# Patient Record
Sex: Female | Born: 1995 | Race: Black or African American | Hispanic: No | Marital: Single | Smoking: Never smoker
Health system: Southern US, Community
[De-identification: ages and names within clinical notes are randomized; demographics above are authoritative.]

## PROBLEM LIST (undated history)

## (undated) DIAGNOSIS — L309 Dermatitis, unspecified: Secondary | ICD-10-CM

## (undated) DIAGNOSIS — E0590 Thyrotoxicosis, unspecified without thyrotoxic crisis or storm: Secondary | ICD-10-CM

## (undated) DIAGNOSIS — D649 Anemia, unspecified: Secondary | ICD-10-CM

## (undated) HISTORY — DX: Anemia, unspecified: D64.9

## (undated) HISTORY — DX: Dermatitis, unspecified: L30.9

## (undated) HISTORY — PX: NO PAST SURGERIES: SHX2092

---
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
## 2016-12-03 ENCOUNTER — Encounter: Payer: Self-pay | Admitting: Family Medicine

## 2016-12-03 ENCOUNTER — Ambulatory Visit (INDEPENDENT_AMBULATORY_CARE_PROVIDER_SITE_OTHER): Payer: BLUE CROSS/BLUE SHIELD | Admitting: Family Medicine

## 2016-12-03 ENCOUNTER — Other Ambulatory Visit (HOSPITAL_COMMUNITY)
Admission: RE | Admit: 2016-12-03 | Discharge: 2016-12-03 | Disposition: A | Discharge status: Home / Self Care | Payer: BLUE CROSS/BLUE SHIELD | Source: Ambulatory Visit | Attending: Family Medicine | Admitting: Family Medicine

## 2016-12-03 VITALS — BP 120/80 | HR 64 | Ht 59.5 in | Wt 102.6 lb

## 2016-12-03 DIAGNOSIS — Z111 Encounter for screening for respiratory tuberculosis: Secondary | ICD-10-CM

## 2016-12-03 DIAGNOSIS — Z0000 Encounter for general adult medical examination without abnormal findings: Secondary | ICD-10-CM | POA: Diagnosis not present

## 2016-12-03 DIAGNOSIS — Z124 Encounter for screening for malignant neoplasm of cervix: Secondary | ICD-10-CM | POA: Diagnosis not present

## 2016-12-03 DIAGNOSIS — Z113 Encounter for screening for infections with a predominantly sexual mode of transmission: Secondary | ICD-10-CM

## 2016-12-03 DIAGNOSIS — Z7689 Persons encountering health services in other specified circumstances: Secondary | ICD-10-CM | POA: Insufficient documentation

## 2016-12-03 LAB — CBC WITH DIFFERENTIAL/PLATELET
BASOS ABS: 35 {cells}/uL (ref 0–200)
Basophils Relative: 1 %
EOS ABS: 35 {cells}/uL (ref 15–500)
Eosinophils Relative: 1 %
HEMATOCRIT: 41.2 % (ref 35.0–45.0)
Hemoglobin: 13.3 g/dL (ref 11.7–15.5)
LYMPHS PCT: 58 %
Lymphs Abs: 2030 cells/uL (ref 850–3900)
MCH: 29.6 pg (ref 27.0–33.0)
MCHC: 32.3 g/dL (ref 32.0–36.0)
MCV: 91.6 fL (ref 80.0–100.0)
MONOS PCT: 6 %
MPV: 10.1 fL (ref 7.5–12.5)
Monocytes Absolute: 210 cells/uL (ref 200–950)
NEUTROS PCT: 34 %
Neutro Abs: 1190 cells/uL — ABNORMAL LOW (ref 1500–7800)
PLATELETS: 254 10*3/uL (ref 140–400)
RBC: 4.5 MIL/uL (ref 3.80–5.10)
RDW: 13.5 % (ref 11.0–15.0)
WBC: 3.5 10*3/uL — AB (ref 4.0–10.5)

## 2016-12-03 LAB — COMPREHENSIVE METABOLIC PANEL
ALT: 12 U/L (ref 6–29)
AST: 20 U/L (ref 10–30)
Albumin: 4.2 g/dL (ref 3.6–5.1)
Alkaline Phosphatase: 61 U/L (ref 33–115)
BUN: 8 mg/dL (ref 7–25)
CALCIUM: 9 mg/dL (ref 8.6–10.2)
CHLORIDE: 106 mmol/L (ref 98–110)
CO2: 25 mmol/L (ref 20–31)
Creat: 0.6 mg/dL (ref 0.50–1.10)
Glucose, Bld: 91 mg/dL (ref 65–99)
POTASSIUM: 4.1 mmol/L (ref 3.5–5.3)
Sodium: 141 mmol/L (ref 135–146)
Total Bilirubin: 0.3 mg/dL (ref 0.2–1.2)
Total Protein: 7.4 g/dL (ref 6.1–8.1)

## 2016-12-03 LAB — POCT URINALYSIS DIPSTICK
BILIRUBIN UA: NEGATIVE
Glucose, UA: NEGATIVE
KETONES UA: NEGATIVE
LEUKOCYTES UA: NEGATIVE
Nitrite, UA: NEGATIVE
PH UA: 6
PROTEIN UA: NEGATIVE
SPEC GRAV UA: 1.03
Urobilinogen, UA: NEGATIVE — AB

## 2016-12-03 NOTE — Patient Instructions (Addendum)
You can call and schedule your Dentist appointment at any of the following offices:   [facility] Address: [address]  [place], [place] [address] Phone #: [phone]  [facility] Address: [address] [place], [place] [address] Phone # [phone]  J. Hazeline JunkerSelig Cooper, DDS [facility]  Address: [address]                                                                 [place], [place] [address] Phone #: [phone]   For your eye exam: you can try [facility] [phone]  I recommend you get your third HPV vaccine. Call and schedule this whenever you are ready.   Start getting at least 150 minutes of physical activity per week.    Preventative Care for Adults - Female      MAINTAIN REGULAR HEALTH EXAMS:  A routine yearly physical is a good way to check in with your primary care provider about your health and preventive screening. It is also an opportunity to share updates about your health and any concerns you have, and receive a thorough all-over exam.   Most health insurance companies pay for at least some preventative services.  Check with your health plan for specific coverages.  WHAT PREVENTATIVE SERVICES DO WOMEN NEED?  Adult women should have their weight and blood pressure checked regularly.   Women age 21 and older should have their cholesterol levels checked regularly.  Women should be screened for cervical cancer with a Pap smear and pelvic exam beginning at either age 21, or 3 years after they become sexually activity.    Breast cancer screening generally begins at age 21 with a mammogram and breast exam by your primary care provider.    Beginning at age 21 and continuing to age 21, women should be screened for colorectal cancer.  Certain people may need continued testing until age 21.  Updating vaccinations is part of preventative care.  Vaccinations help protect against diseases such as the  flu.  Osteoporosis is a disease in which the bones lose minerals and strength as we age. Women ages 8165 and over should discuss this with their caregivers, as should women after menopause who have other risk factors.  Lab tests are generally done as part of preventative care to screen for anemia and blood disorders, to screen for problems with the kidneys and liver, to screen for bladder problems, to check blood sugar, and to check your cholesterol level.  Preventative services generally include counseling about diet, exercise, avoiding tobacco, drugs, excessive alcohol consumption, and sexually transmitted infections.    GENERAL RECOMMENDATIONS FOR GOOD HEALTH:  Healthy diet:  Eat a variety of foods, including fruit, vegetables, animal or vegetable protein, such as meat, fish, chicken, and eggs, or beans, lentils, tofu, and grains, such as rice.  Drink plenty of water daily.  Decrease saturated fat in the diet, avoid lots of red meat, processed foods, sweets, fast foods, and fried foods.  Exercise:  Aerobic exercise helps maintain good heart health. At least 30-40 minutes of moderate-intensity exercise is recommended. For example, a brisk walk that increases your heart rate and breathing. This should be done on most days of the week.   Find a type  of exercise or a variety of exercises that you enjoy so that it becomes a part of your daily life.  Examples are running, walking, swimming, water aerobics, and biking.  For motivation and support, explore group exercise such as aerobic class, spin class, Zumba, Yoga,or  martial arts, etc.    Set exercise goals for yourself, such as a certain weight goal, walk or run in a race such as a 5k walk/run.  Speak to your primary care provider about exercise goals.  Disease prevention:  If you smoke or chew tobacco, find out from your caregiver how to quit. It can literally save your life, no matter how long you have been a tobacco user. If you do not  use tobacco, never begin.   Maintain a healthy diet and normal weight. Increased weight leads to problems with blood pressure and diabetes.   The Body Mass Index or BMI is a way of measuring how much of your body is fat. Having a BMI above 27 increases the risk of heart disease, diabetes, hypertension, stroke and other problems related to obesity. Your caregiver can help determine your BMI and based on it develop an exercise and dietary program to help you achieve or maintain this important measurement at a healthful level.  High blood pressure causes heart and blood vessel problems.  Persistent high blood pressure should be treated with medicine if weight loss and exercise do not work.   Fat and cholesterol leaves deposits in your arteries that can block them. This causes heart disease and vessel disease elsewhere in your body.  If your cholesterol is found to be high, or if you have heart disease or certain other medical conditions, then you may need to have your cholesterol monitored frequently and be treated with medication.   Ask if you should have a cardiac stress test if your history suggests this. A stress test is a test done on a treadmill that looks for heart disease. This test can find disease prior to there being a problem.  Menopause can be associated with physical symptoms and risks. Hormone replacement therapy is available to decrease these. You should talk to your caregiver about whether starting or continuing to take hormones is right for you.   Osteoporosis is a disease in which the bones lose minerals and strength as we age. This can result in serious bone fractures. Risk of osteoporosis can be identified using a bone density scan. Women ages 40 and over should discuss this with their caregivers, as should women after menopause who have other risk factors. Ask your caregiver whether you should be taking a calcium supplement and Vitamin D, to reduce the rate of osteoporosis.   Avoid  drinking alcohol in excess (more than two drinks per day).  Avoid use of street drugs. Do not share needles with anyone. Ask for professional help if you need assistance or instructions on stopping the use of alcohol, cigarettes, and/or drugs.  Brush your teeth twice a day with fluoride toothpaste, and floss once a day. Good oral hygiene prevents tooth decay and gum disease. The problems can be painful, unattractive, and can cause other health problems. Visit your dentist for a routine oral and dental check up and preventive care every 6-12 months.   Look at your skin regularly.  Use a mirror to look at your back. Notify your caregivers of changes in moles, especially if there are changes in shapes, colors, a size larger than a pencil eraser, an irregular border, or development  of new moles.  Safety:  Use seatbelts 100% of the time, whether driving or as a passenger.  Use safety devices such as hearing protection if you work in environments with loud noise or significant background noise.  Use safety glasses when doing any work that could send debris in to the eyes.  Use a helmet if you ride a bike or motorcycle.  Use appropriate safety gear for contact sports.  Talk to your caregiver about gun safety.  Use sunscreen with a SPF (or skin protection factor) of 15 or greater.  Lighter skinned people are at a greater risk of skin cancer. Don't forget to also wear sunglasses in order to protect your eyes from too much damaging sunlight. Damaging sunlight can accelerate cataract formation.   Practice safe sex. Use condoms. Condoms are used for birth control and to help reduce the spread of sexually transmitted infections (or STIs).  Some of the STIs are gonorrhea (the clap), chlamydia, syphilis, trichomonas, herpes, HPV (human papilloma virus) and HIV (human immunodeficiency virus) which causes AIDS. The herpes, HIV and HPV are viral illnesses that have no cure. These can result in disability, cancer and  death.   Keep carbon monoxide and smoke detectors in your home functioning at all times. Change the batteries every 6 months or use a model that plugs into the wall.   Vaccinations:  Stay up to date with your tetanus shots and other required immunizations. You should have a booster for tetanus every 10 years. Be sure to get your flu shot every year, since 5%-20% of the [place] population comes down with the flu. The flu vaccine changes each year, so being vaccinated once is not enough. Get your shot in the fall, before the flu season peaks.   Other vaccines to consider:  Human Papilloma Virus or HPV causes cancer of the cervix, and other infections that can be transmitted from person to person. There is a vaccine for HPV, and females should get immunized between the ages of 61 and 107. It requires a series of 3 shots.   Pneumococcal vaccine to protect against certain types of pneumonia.  This is normally recommended for adults age 15 or older.  However, adults younger than 21 years old with certain underlying conditions such as diabetes, heart or lung disease should also receive the vaccine.  Shingles vaccine to protect against Varicella Zoster if you are older than age 89, or younger than 21 years old with certain underlying illness.  Hepatitis A vaccine to protect against a form of infection of the liver by a virus acquired from food.  Hepatitis B vaccine to protect against a form of infection of the liver by a virus acquired from blood or body fluids, particularly if you work in health care.  If you plan to travel internationally, check with your local health department for specific vaccination recommendations.  Cancer Screening:  Breast cancer screening is essential to preventive care for women. All women age 30 and older should perform a breast self-exam every month. At age 15 and older, women should have their caregiver complete a breast exam each year. Women at ages 65 and older should have  a mammogram (x-ray film) of the breasts. Your caregiver can discuss how often you need mammograms.    Cervical cancer screening includes taking a Pap smear (sample of cells examined under a microscope) from the cervix (end of the uterus). It also includes testing for HPV (Human Papilloma Virus, which can cause cervical cancer). Screening  and a pelvic exam should begin at age 54, or 3 years after a woman becomes sexually active. Screening should occur every year, with a Pap smear but no HPV testing, up to age 76. After age 58, you should have a Pap smear every 3 years with HPV testing, if no HPV was found previously.   Most routine colon cancer screening begins at the age of 84. On a yearly basis, doctors may provide special easy to use take-home tests to check for hidden blood in the stool. Sigmoidoscopy or colonoscopy can detect the earliest forms of colon cancer and is life saving. These tests use a small camera at the end of a tube to directly examine the colon. Speak to your caregiver about this at age 81, when routine screening begins (and is repeated every 5 years unless early forms of pre-cancerous polyps or small growths are found).

## 2016-12-03 NOTE — Progress Notes (Signed)
Subjective:    Patient ID: Katie Pratt, female    DOB: 1996-06-14, 21 y.o.   MRN: [id number]  HPI Chief Complaint  Patient presents with  . new pt    new pt, cpe and tb skin with pap. will be getting eyes checked soon   She is new to the practice and here for a complete physical exam. She has not concerns or complaints today.   Previous medical care: [facility].  Last CPE: last year  Other providers: none  Social history: Lives with mother, works at [employer], Holiday representativejunior at [employer].  Denies smoking, drinking alcohol, drug use  Diet: unhealthy. Snacks during the day.  Excerise: hardly ever.   Immunizations: needs TB test. HPV- she only got 2 of these.   Health maintenance:  Mammogram: N/A Colonoscopy: N/A Last Pap Smear: never Last Menstrual cycle: 11/27/16 She is sexually active with one female partner.  Denies history of STIs.  Using condoms for contraception. Has tried patches and depo injections in the past.  Pregnancies: 0 Last Dental Exam: twice annually  Last Eye Exam: last year  Wears seatbelt always, uses sunscreen, smoke detectors in home and functioning, does not text while driving and feels safe in home environment.   Reviewed allergies, medications, past medical, surgical, family, and social history.   Review of Systems Review of Systems Constitutional: -fever, -chills, -sweats, -unexpected weight change,-fatigue ENT: -runny nose, -ear pain, -sore throat Cardiology:  -chest pain, -palpitations, -edema Respiratory: -cough, -shortness of breath, -wheezing Gastroenterology: -abdominal pain, -nausea, -vomiting, -diarrhea, -constipation  Hematology: -bleeding or bruising problems Musculoskeletal: -arthralgias, -myalgias, -joint swelling, -back pain Ophthalmology: -vision changes Urology: -dysuria, -difficulty urinating, -hematuria, -urinary frequency, -urgency Neurology: -headache, -weakness, -tingling, -numbness       Objective:   Physical Exam BP  120/80   Pulse 64   Ht 4' 11.5" (1.511 m)   Wt 102 lb 9.6 oz (46.5 kg)   LMP 11/27/2016   BMI 20.38 kg/m   General Appearance:    Alert, cooperative, no distress, appears stated age  Head:    Normocephalic, without obvious abnormality, atraumatic  Eyes:    PERRL, conjunctiva/corneas clear, EOM's intact, fundi    benign  Ears:    Normal TM's and external ear canals  Nose:   Nares normal, mucosa normal, no drainage or sinus   tenderness  Throat:   Lips, mucosa, and tongue normal; teeth and gums normal  Neck:   Supple, no lymphadenopathy;  thyroid:  no   enlargement/tenderness/nodules; no carotid   bruit or JVD  Back:    Spine nontender, no curvature, ROM normal, no CVA     tenderness  Lungs:     Clear to auscultation bilaterally without wheezes, rales or     ronchi; respirations unlabored  Chest Wall:    No tenderness or deformity   Heart:    Regular rate and rhythm, S1 and S2 normal, no murmur, rub   or gallop  Breast Exam:    Declined.    No axillary lymphadenopathy  Abdomen:     Soft, non-tender, nondistended, normoactive bowel sounds,    no masses, no hepatosplenomegaly  Genitalia:    Normal external genitalia without lesions.  BUS and vagina normal; cervix without lesions, or cervical motion tenderness. No abnormal vaginal discharge.  Uterus and adnexa not enlarged, nontender, no masses.  Pap performed. Chaperone present.   Rectal:    Not performed due to age<40 and no related complaints  Extremities:   No clubbing, cyanosis or  edema  Pulses:   2+ and symmetric all extremities  Skin:   Skin color, texture, turgor normal, no rashes or lesions  Lymph nodes:   Cervical, supraclavicular, and axillary nodes normal  Neurologic:   CNII-XII intact, normal strength, sensation and gait; reflexes 2+ and symmetric throughout          Psych:   Normal mood, affect, hygiene and grooming.    Urinalysis dipstick: blood 1+ (menses) otherwise neg      Assessment & Plan:  Routine general  medical examination at a health care facility - Plan: Urinalysis Dipstick, CBC with Differential/Platelet, Comprehensive metabolic panel  Encounter to establish care  Visit for TB skin test - Plan: TB Skin Test  Screening for STD (sexually transmitted disease) - Plan: RPR, HIV antibody  Screening for cervical cancer - Plan: Cytology - PAP  Discussed that she appears to be healthy. Recommend eating a healthy well balanced diet and starting to exercise at least 150 minutes per week.  She would like a TB skin test, needs this for college.  Her first pap smear was performed today, she tolerated this well. Chaperone present.  She is safe in her home and relationships.  STD testing performed.  Reports having 2 of the 3 shot series of HPV injections and refuses the 3rd one today. Will return if she decides to get this. Counseled her on the reason this is recommended.  Will follow up pending labs. She is not fasting, will not check lipids.

## 2016-12-04 LAB — RPR

## 2016-12-04 LAB — HIV ANTIBODY (ROUTINE TESTING W REFLEX): HIV 1&2 Ab, 4th Generation: NONREACTIVE

## 2016-12-05 LAB — CYTOLOGY - PAP
CHLAMYDIA, DNA PROBE: NEGATIVE
Diagnosis: NEGATIVE
NEISSERIA GONORRHEA: NEGATIVE

## 2016-12-06 ENCOUNTER — Other Ambulatory Visit: Payer: Self-pay | Admitting: Family Medicine

## 2016-12-06 DIAGNOSIS — D72819 Decreased white blood cell count, unspecified: Secondary | ICD-10-CM

## 2016-12-06 LAB — TB SKIN TEST: TB SKIN TEST: NEGATIVE

## 2017-03-13 ENCOUNTER — Encounter: Payer: Self-pay | Admitting: Family Medicine

## 2017-03-13 ENCOUNTER — Ambulatory Visit (INDEPENDENT_AMBULATORY_CARE_PROVIDER_SITE_OTHER): Payer: BLUE CROSS/BLUE SHIELD | Admitting: Family Medicine

## 2017-03-13 VITALS — BP 102/70 | HR 64 | Temp 98.5°F | Ht 59.5 in | Wt 105.8 lb

## 2017-03-13 DIAGNOSIS — L309 Dermatitis, unspecified: Secondary | ICD-10-CM

## 2017-03-13 MED ORDER — CLOBETASOL PROPIONATE 0.05 % EX CREA: 1.0000 "application " | TOPICAL_CREAM | Freq: Two times a day (BID) | CUTANEOUS | 0 refills | Status: DC

## 2017-03-13 NOTE — Patient Instructions (Signed)

## 2017-03-13 NOTE — Progress Notes (Signed)
Chief Complaint  Patient presents with  . Eczema    having an eczema flair x 2 days. Was using clobetasol that she thinks is expired-not helping. On neck, behind knees and on right breast.   Rash started flaring on the back of her neck and behind her knees.  It started a couple of weeks ago, but is burning/painful in the last couple of days.  Usually only gets eczema flares when it is hot in the summer (not in cold weather).  Usually gets behind the knees, hasn't had on the neck before. No new products, exposures, foods.  She used the old clobetasol cream twice, about 10 days ago, then realized it was old and stopped it.  After she stopped using it (10 days ago), it has continued to spread on her neck. She has been using Cetaphil twice a day, and an OTC cortisone cream.  PMH, PSH, SH reviewed  Outpatient Encounter Prescriptions as of 03/13/2017  Medication Sig Note  . clobetasol cream (TEMOVATE) 0.05 % Apply 1 application topically 2 (two) times daily.   . [DISCONTINUED] clobetasol cream (TEMOVATE) 0.05 % Apply 1 application topically 2 (two) times daily. 03/13/2017: Uses prn, restarted, but it is old/expired  . cetirizine (ZYRTEC) 10 MG tablet Take 10 mg by mouth daily.    No facility-administered encounter medications on file as of 03/13/2017.    No Known Allergies  ROS: no fever, chills, URI symptoms, headache, GI or other complaints, just the rash as per HPI.   PHYSICAL EXAM:  BP 102/70 (BP Location: Right Arm, Patient Position: Sitting, Cuff Size: Normal)   Pulse 64   Temp 98.5 F (36.9 C) (Tympanic)   Ht 4' 11.5" (1.511 m)   Wt 105 lb 12.8 oz (48 kg)   LMP 03/06/2017 (Exact Date)   BMI 21.01 kg/m   Well appearing, pleasant female in no distress HEENT: conjunctiva and sclera are clear, EOMI Neck: no lymphadenopathy or mass Skin: Posterior neck there are two hyperpigmented areas--these seem dark, but also slightly pink at the edges--the one on the left is round, measuring  approx 3x3cm.  There is a slight linear connection to the area on the right, which is more irregularly shaped, measuring about 3.5 x 3cm There is no central clearing, no weeping, erythema or soft tissue swelling.  Popliteal fossa--on the left is a larger, 3.5 cm hyperpigmented dry patch. On the right there is a smaller hyperpigmented  Patch 1.5cm, and a smaller area just below this.  ASSESSMENT/PLAN:   Eczema, unspecified type - Plan: clobetasol cream (TEMOVATE) 0.05 %  Educated re: eczema, proper use of steroid creams and risks. Reviewed signs and symptoms of infection, and to return if symptoms persist/worsen.  To look for possible triggers/allergies, and avoid.

## 2017-04-01 ENCOUNTER — Encounter: Payer: Self-pay | Admitting: Family Medicine

## 2017-04-01 ENCOUNTER — Ambulatory Visit (INDEPENDENT_AMBULATORY_CARE_PROVIDER_SITE_OTHER): Payer: BLUE CROSS/BLUE SHIELD | Admitting: Family Medicine

## 2017-04-01 VITALS — BP 100/70 | HR 64 | Wt 105.0 lb

## 2017-04-01 DIAGNOSIS — L739 Follicular disorder, unspecified: Secondary | ICD-10-CM

## 2017-04-01 NOTE — Patient Instructions (Addendum)
Use either Lever 2000 or Dial soap for the next week or so and if it doesn't go away call. For the itching use cool compresses or Benadryl at night

## 2017-04-01 NOTE — Progress Notes (Signed)
   Subjective:    Patient ID: Katie Pratt, female    DOB: 05/06/1996, 21 y.o.   MRN: [id number]  HPI She has a three-day history of a rash present on both posterior arms and now has noticed that also on the left neck area. No new soaps, detergents, colognes or exposure to other chemicals. No fever, chills, sore throat, cough or congestion.   Review of Systems     Objective:   Physical Exam A follicular pattern is noted on the posterior arm area as well as the left neck. Exam of the back and torso is otherwise negative.       Assessment & Plan:  Folliculitis  I will treat this conservatively with either Lever 2000 or Dial soap. Also recommend cool compresses for the itching and possibly  Benadryl at night

## 2017-04-04 ENCOUNTER — Telehealth: Payer: Self-pay | Admitting: Family Medicine

## 2017-04-04 ENCOUNTER — Ambulatory Visit (INDEPENDENT_AMBULATORY_CARE_PROVIDER_SITE_OTHER): Payer: BLUE CROSS/BLUE SHIELD | Admitting: Family Medicine

## 2017-04-04 ENCOUNTER — Encounter: Payer: Self-pay | Admitting: Family Medicine

## 2017-04-04 VITALS — BP 120/70 | HR 63 | Temp 98.3°F | Wt 106.0 lb

## 2017-04-04 DIAGNOSIS — R21 Rash and other nonspecific skin eruption: Secondary | ICD-10-CM | POA: Diagnosis not present

## 2017-04-04 DIAGNOSIS — Z79899 Other long term (current) drug therapy: Secondary | ICD-10-CM

## 2017-04-04 LAB — POCT URINE PREGNANCY: PREG TEST UR: NEGATIVE

## 2017-04-04 MED ORDER — FLUCONAZOLE 150 MG PO TABS: 150.0000 mg | ORAL_TABLET | Freq: Once | ORAL | 1 refills | Status: AC

## 2017-04-04 MED ORDER — HYDROXYZINE HCL 25 MG PO TABS: 25.0000 mg | ORAL_TABLET | Freq: Three times a day (TID) | ORAL | 0 refills | Status: DC | PRN

## 2017-04-04 MED ORDER — PREDNISONE 10 MG (21) PO TBPK: ORAL_TABLET | Freq: Every day | ORAL | 0 refills | Status: DC

## 2017-04-04 NOTE — Telephone Encounter (Signed)
Called [facility] t# [phone] for P.A. Clobetasol and they stated pt's ins is Medicaid family planning and only covers medications for family planning.  Left message for pt

## 2017-04-04 NOTE — Patient Instructions (Addendum)
Take the steroid dose pack as prescribed. Take Diflucan (one pill) and then on day 4 take the second dose.   Call if you are not improving or getting worse.   See the dermatologist.   Use caution with the hydroxyzine, this may make you sleepy. Take cool showers and use cool compresses

## 2017-04-04 NOTE — Telephone Encounter (Signed)
Pt also has [employer] & was filled with that ins

## 2017-04-04 NOTE — Progress Notes (Addendum)
   Subjective:    Patient ID: Katie Pratt, female    DOB: 1996-02-06, 21 y.o.   MRN: [id number]  HPI Chief Complaint  Patient presents with  . skin rash    skin rash. was seen a couple days ago by Dr. Susann GivensLalonde. she thinks its has spead to other places and its itches more   She is here with complaints of a rash that is getting worse for the past 2 weeks on her arms and has now spread to her upper chest and both sides of her neck. States she has been using hydrocortisone for the past week and states it is getting worse. States she has been using Dial soap as recommended.  States Benadryl is not helping with itching. She has also tried cool compresses.   Denies new soaps, lotions, detergents, foods or medications.  Denies fever, chills, sore throat, headache, abdominal pain, N/V/D or rash to any other parts of her body.   States she has a history of eczema and has been using Clobetasol as needed "for years" mainly on the back of her neck and and behind her knees.    LMP: 4 weeks ago. No birth control.   Reviewed allergies, medications, past medical, surgical, and social history.   Review of Systems Pertinent positives and negatives in the history of present illness.     Objective:   Physical Exam BP 120/70   Pulse 63   Temp 98.3 F (36.8 C) (Oral)   Wt 106 lb (48.1 kg)   LMP 03/06/2017 (Exact Date)   SpO2 98%   BMI 21.05 kg/m  Alert and in no distress.  Pharyngeal area is normal.  Neck is supple without adenopathy or thyromegaly.  Cardiac exam shows a regular sinus rhythm without murmurs or gallops. Lungs are clear to auscultation. Diffuse raised pruritic bumps to her bilateral upper extremities, anterior upper chest and bilateral sides of neck. No surrounding erythema, exudate, or signs of secondary infection.  Posterior neck with thickened and hyperpigmented areas.       Assessment & Plan:  Rash and nonspecific skin eruption  Medication management - Plan: POCT  urine pregnancy  Discussed possible etiologies of rash including bacterial vs fungal vs allergic dermatitis. Diagnosis is clear.  Will treat her with oral steroids and Diflucan.  Vincenza HewsShane, [place], also examined patient and agrees with plan.  Will prescribe hydroxyzine for severe itching. She will use cool compresses and cool showers.  Consider oral antibiotic if not improving.  Will refer her to dermatologist as well. The thick and dark areas on her posterior neck do not appear to be related and may be related to history of steroid use for eczema.

## 2017-08-18 ENCOUNTER — Encounter: Payer: Self-pay | Admitting: Family Medicine

## 2017-08-18 ENCOUNTER — Other Ambulatory Visit: Payer: Self-pay | Admitting: Family Medicine

## 2017-08-18 ENCOUNTER — Ambulatory Visit (INDEPENDENT_AMBULATORY_CARE_PROVIDER_SITE_OTHER): Payer: BLUE CROSS/BLUE SHIELD | Admitting: Family Medicine

## 2017-08-18 VITALS — BP 120/70 | HR 80 | Temp 98.4°F | Wt 110.4 lb

## 2017-08-18 DIAGNOSIS — R1030 Lower abdominal pain, unspecified: Secondary | ICD-10-CM | POA: Diagnosis not present

## 2017-08-18 DIAGNOSIS — Z862 Personal history of diseases of the blood and blood-forming organs and certain disorders involving the immune mechanism: Secondary | ICD-10-CM

## 2017-08-18 DIAGNOSIS — N939 Abnormal uterine and vaginal bleeding, unspecified: Secondary | ICD-10-CM | POA: Diagnosis not present

## 2017-08-18 DIAGNOSIS — Z113 Encounter for screening for infections with a predominantly sexual mode of transmission: Secondary | ICD-10-CM | POA: Diagnosis not present

## 2017-08-18 DIAGNOSIS — N923 Ovulation bleeding: Secondary | ICD-10-CM

## 2017-08-18 LAB — POCT URINALYSIS DIP (PROADVANTAGE DEVICE)
BILIRUBIN UA: NEGATIVE
Glucose, UA: NEGATIVE mg/dL
Leukocytes, UA: NEGATIVE
Nitrite, UA: NEGATIVE
PH UA: 7 (ref 5.0–8.0)
Protein Ur, POC: NEGATIVE mg/dL
Specific Gravity, Urine: 1.02
Urobilinogen, Ur: 3.5

## 2017-08-18 LAB — POCT URINE PREGNANCY: PREG TEST UR: NEGATIVE

## 2017-08-18 MED ORDER — CETIRIZINE HCL 10 MG PO TABS: 10.0000 mg | ORAL_TABLET | Freq: Every day | ORAL | 3 refills | Status: DC

## 2017-08-18 NOTE — Progress Notes (Signed)
Subjective:    Patient ID: Katie Pratt, female    DOB: 18-Jul-1996, 21 y.o.   MRN: [id number]  HPI Chief Complaint  Patient presents with  . 2 periods each month    last 3 mo = 2 periods per month 5 days each, last one 9 days.  Pressure in stomach   She is here with complaints of AUB for the past 3 months. States she is having her regular period with regular bleeding for 5 days and then 2 weeks later she has another period which is light. Denies heavy bleeding or clotting. Complains of lower abdominal pressure for the past 2 days. Denies having lower abdominal pain now.  States today she had brown discharge when she wiped.  Last bowel movement today. States it was loose but no diarrhea, no blood. No urinary symptoms.   Denies sexual activity since February 2018 per patient.  No contraception.  Used a patch over a year ago.   She has been using a "syrup" called APETAMIN to help her gain weight. States her periods became irregular shortly after this.   Denies fever, chills, fatigue, dizziness, chest pain, palpitations, shortness of breath, back pain, urinary symptoms.    Denies personal or family history of bleeding disorder.   Reviewed allergies, medications, past medical, surgical, family, and social history.    Review of Systems Pertinent positives and negatives in the history of present illness.      Objective:   Physical Exam  Constitutional: She is oriented to person, place, and time. She appears well-developed and well-nourished. No distress.  HENT:  Mouth/Throat: Oropharynx is clear and moist.  Eyes: Conjunctivae are normal. Pupils are equal, round, and reactive to light.  Neck: Normal range of motion. Neck supple.  Cardiovascular: Normal rate, regular rhythm, normal heart sounds and intact distal pulses.  Pulmonary/Chest: Effort normal and breath sounds normal.  Abdominal: Soft. Bowel sounds are normal. She exhibits no distension. There is no tenderness. Hernia  confirmed negative in the right inguinal area and confirmed negative in the left inguinal area.  Genitourinary: There is no rash, tenderness or lesion on the right labia. There is no rash, tenderness or lesion on the left labia. Uterus is not enlarged and not tender. Cervix exhibits no motion tenderness and no friability. Right adnexum displays no mass, no tenderness and no fullness. Left adnexum displays no mass, no tenderness and no fullness. No tenderness in the vagina.  Lymphadenopathy:    She has no cervical adenopathy.       Right: No inguinal adenopathy present.       Left: No inguinal adenopathy present.  Neurological: She is alert and oriented to person, place, and time.  Skin: Skin is warm and dry. No rash noted. No pallor.  Psychiatric: She has a normal mood and affect. Her behavior is normal. Thought content normal.   BP 120/70   Pulse 80   Temp 98.4 F (36.9 C) (Oral)   Wt 110 lb 6.4 oz (50.1 kg)   BMI 21.92 kg/m         Assessment & Plan:  Abnormal uterine bleeding (AUB) - Plan: POCT Urinalysis DIP (Proadvantage Device), CBC with Differential/Platelet, TSH, C. trachomatis/N. gonorrhoeae RNA, POCT urine pregnancy, Ambulatory referral to Obstetrics / Gynecology, CANCELED: Wet Prep for Trick, Yeast, Clue, CANCELED: Wet Prep for Trick, Yeast, Clue  Lower abdominal pain - Plan: C. trachomatis/N. gonorrhoeae RNA, POCT urine pregnancy, CANCELED: Wet Prep for Trick, Yeast, Clue, CANCELED: Wet Prep for Trick, Yeast,  Clue  Screen for STD (sexually transmitted disease) - Plan: C. trachomatis/N. gonorrhoeae RNA, RPR, HIV antibody  History of anemia - Plan: CBC with Differential/Platelet  Intermenstrual bleeding - Plan: Ambulatory referral to Obstetrics / Gynecology  UPT-negative  UA - trace blood Wet prep sent. STD testing done including GC/CT, Trick.  Will check labs due to bleeding and history of anemia.  She is hemodynamically stable. She is in no distress. Discussed  starting her back on hormones at some point if her menstrual cycles continue to be regular. Plan to refer her to OB/GYN for further evaluation.  Discussed that irregular bleeding could be related to multiple etiologies including endometrial polyps and this should be ruled out. Follow-up pending results.

## 2017-08-19 LAB — CBC WITH DIFFERENTIAL/PLATELET
BASOS ABS: 22 {cells}/uL (ref 0–200)
BASOS PCT: 0.5 %
EOS ABS: 9 {cells}/uL — AB (ref 15–500)
EOS PCT: 0.2 %
HEMATOCRIT: 40.2 % (ref 35.0–45.0)
Hemoglobin: 13.6 g/dL (ref 11.7–15.5)
LYMPHS ABS: 1660 {cells}/uL (ref 850–3900)
MCH: 30 pg (ref 27.0–33.0)
MCHC: 33.8 g/dL (ref 32.0–36.0)
MCV: 88.7 fL (ref 80.0–100.0)
MPV: 11 fL (ref 7.5–12.5)
Monocytes Relative: 4.7 %
Neutro Abs: 2408 cells/uL (ref 1500–7800)
Neutrophils Relative %: 56 %
Platelets: 265 10*3/uL (ref 140–400)
RBC: 4.53 10*6/uL (ref 3.80–5.10)
RDW: 12.3 % (ref 11.0–15.0)
Total Lymphocyte: 38.6 %
WBC mixed population: 202 cells/uL (ref 200–950)
WBC: 4.3 10*3/uL (ref 3.8–10.8)

## 2017-08-19 LAB — C. TRACHOMATIS/N. GONORRHOEAE RNA
C. TRACHOMATIS RNA, TMA: NOT DETECTED
N. gonorrhoeae RNA, TMA: NOT DETECTED

## 2017-08-19 LAB — TSH: TSH: 0.67 mIU/L

## 2017-08-19 LAB — HIV ANTIBODY (ROUTINE TESTING W REFLEX): HIV: NONREACTIVE

## 2017-08-19 LAB — RPR: RPR: NONREACTIVE

## 2017-08-21 LAB — TRICHOMONAS VAGINALIS, PROBE AMP: TRICHOMONAS VAGINALIS RNA: NOT DETECTED

## 2017-08-21 LAB — C ALBICANS, DNA: C. ALBICANS, DNA: DETECTED — AB

## 2017-08-25 ENCOUNTER — Other Ambulatory Visit: Payer: Self-pay | Admitting: Family Medicine

## 2017-08-25 MED ORDER — FLUCONAZOLE 150 MG PO TABS: 150.0000 mg | ORAL_TABLET | Freq: Once | ORAL | 0 refills | Status: AC

## 2018-05-12 NOTE — Progress Notes (Signed)
Subjective:    Patient ID: Katie Pratt, female    DOB: [birth year], 22 y.o.   MRN: [id number]  HPI Chief Complaint  Patient presents with  . cpe    cpe, irriatation in vaginal issues-itchy and discharge, headaches. pap done in 2018-normal   She is here for a complete physical exam. She also complains of vaginal discharge and itching for the past 2 weeks. She is sexually active. Uses condoms. Menses is late. Reports having irregular periods and this is not new.   Concerns about right ear lobe being torn and the pierced hole is much larger than her left ear lobe. She is still able to wear earrings. This is a chronic issue. She is not sure how this happened but thinks it was related to wearing heavy earrings. She would like to have this fixed.   Other providers: none  Social history: Lives alone, she is a [employer] Denies smoking, drinking alcohol, drug use  Diet: unhealthy, fast food.  Excerise: nothing lately   Immunizations: Tdap, meningo and influenza needed  Health maintenance:  Last Gynecological Exam: 2018 Last Menstrual cycle: middle of last week  Pregnancies: 0 Last Dental Exam: last week  Last Eye Exam: over a year   Wears seatbelt always, smoke detectors in home and functioning, does not text while driving and feels safe in home environment.   Reviewed allergies, medications, past medical, surgical, family, and social history.   Review of Systems Review of Systems Constitutional: -fever, -chills, -sweats, -unexpected weight change,-fatigue ENT: -runny nose, -ear pain, -sore throat Cardiology:  -chest pain, -palpitations, -edema Respiratory: -cough, -shortness of breath, -wheezing Gastroenterology: -abdominal pain, -nausea, -vomiting, -diarrhea, -constipation  Hematology: -bleeding or bruising problems Musculoskeletal: -arthralgias, -myalgias, -joint swelling, -back pain Ophthalmology: -vision changes Urology: -dysuria, -difficulty urinating,  -hematuria, -urinary frequency, -urgency Neurology: -headache, -weakness, -tingling, -numbness       Objective:   Physical Exam BP 110/70   Pulse 64   Ht 5' (1.524 m)   Wt 112 lb 9.6 oz (51.1 kg)   LMP 04/02/2018   BMI 21.99 kg/m   General Appearance:    Alert, cooperative, no distress, appears stated age  Head:    Normocephalic, without obvious abnormality, atraumatic  Eyes:    PERRL, conjunctiva/corneas clear, EOM's intact, fundi    benign  Ears:    Normal TM's and external ear canals  Nose:   Nares normal, mucosa normal, no drainage or sinus   tenderness  Throat:   Lips, mucosa, and tongue normal; teeth and gums normal  Neck:   Supple, no lymphadenopathy;  thyroid:  no   enlargement/tenderness/nodules; no carotid   bruit or JVD  Back:    Spine nontender, no curvature, ROM normal, no CVA     tenderness  Lungs:     Clear to auscultation bilaterally without wheezes, rales or     ronchi; respirations unlabored  Chest Wall:    No tenderness or deformity   Heart:    Regular rate and rhythm, S1 and S2 normal, no murmur, rub   or gallop  Breast Exam:    Declines   Abdomen:     Soft, non-tender, nondistended, normoactive bowel sounds,    no masses, no hepatosplenomegaly  Genitalia:    Normal external genitalia without lesions.  BUS normal and vaginal vault with thick copious discharge; cervix without lesions, or cervical motion tenderness. Thick, whitish-yellow vaginal discharge.  Uterus and adnexa not enlarged, nontender, no masses.  Pap not due.  Rectal:    Not performed due to age<40 and no related complaints  Extremities:   No clubbing, cyanosis or edema  Pulses:   2+ and symmetric all extremities  Skin:   Skin color, texture, turgor normal, no rashes or lesions  Lymph nodes:   Cervical, supraclavicular, and axillary nodes normal  Neurologic:   CNII-XII intact, normal strength, sensation and gait; reflexes 2+ and symmetric throughout          Psych:   Normal mood, affect,  hygiene and grooming.     Urinalysis dipstick: leuk 1+ UPT negative       Assessment & Plan:  Routine general medical examination at a health care facility - Plan: POCT Urinalysis DIP (Proadvantage Device)  Immunization due - Plan: Flu Vaccine QUAD 36+ mos IM, Meningococcal B, OMV (Bexsero), Tdap vaccine greater than or equal to 7yo IM  Torn right ear lobe, initial encounter  Vaginal itching - Plan: POCT Wet Prep ([place]et [place]), NuSwab Vaginitis Plus (VG+)  Vaginal discharge - Plan: POCT Wet Prep ([place]et [place]), RPR, HIV antibody, NuSwab Vaginitis Plus (VG+)  Vaccine counseling  Late menses - Plan: POCT urine pregnancy  She appears to be doing well overall.  Pleasant demeanor. BV and Candida present metronidazole and Diflucan prescribed.  Counseling done on avoiding alcohol with metronidazole. STD screening performed. Discussed that she would most likely need to see a plastic surgeon for her right earlobe repair.  She declines at this time. Immunization counseling done. Meningococcal, influenza and Tdap given today.  Counseled on all components of these vaccines and possible side effects. Discussed safety and health promotion Recommend that she start eating a healthier diet and increase physical activity Follow-up pending lab results

## 2018-05-13 ENCOUNTER — Encounter: Payer: Self-pay | Admitting: Family Medicine

## 2018-05-13 ENCOUNTER — Ambulatory Visit (INDEPENDENT_AMBULATORY_CARE_PROVIDER_SITE_OTHER): Payer: BLUE CROSS/BLUE SHIELD | Admitting: Family Medicine

## 2018-05-13 VITALS — BP 110/70 | HR 64 | Ht 60.0 in | Wt 112.6 lb

## 2018-05-13 DIAGNOSIS — Z23 Encounter for immunization: Secondary | ICD-10-CM | POA: Diagnosis not present

## 2018-05-13 DIAGNOSIS — N898 Other specified noninflammatory disorders of vagina: Secondary | ICD-10-CM

## 2018-05-13 DIAGNOSIS — S01311A Laceration without foreign body of right ear, initial encounter: Secondary | ICD-10-CM

## 2018-05-13 DIAGNOSIS — Z7189 Other specified counseling: Secondary | ICD-10-CM

## 2018-05-13 DIAGNOSIS — Z0000 Encounter for general adult medical examination without abnormal findings: Secondary | ICD-10-CM | POA: Diagnosis not present

## 2018-05-13 DIAGNOSIS — N926 Irregular menstruation, unspecified: Secondary | ICD-10-CM | POA: Diagnosis not present

## 2018-05-13 DIAGNOSIS — Z7185 Encounter for immunization safety counseling: Secondary | ICD-10-CM

## 2018-05-13 LAB — POCT WET PREP (WET MOUNT)
Clue Cells Wet Prep Whiff POC: POSITIVE
TRICHOMONAS WET PREP HPF POC: ABSENT

## 2018-05-13 LAB — POCT URINALYSIS DIP (PROADVANTAGE DEVICE)
Bilirubin, UA: NEGATIVE
Glucose, UA: NEGATIVE mg/dL
Ketones, POC UA: NEGATIVE mg/dL
Nitrite, UA: NEGATIVE
PH UA: 7 (ref 5.0–8.0)
PROTEIN UA: NEGATIVE mg/dL
SPECIFIC GRAVITY, URINE: 1.015
Urobilinogen, Ur: NEGATIVE

## 2018-05-13 LAB — POCT URINE PREGNANCY: Preg Test, Ur: NEGATIVE

## 2018-05-13 MED ORDER — METRONIDAZOLE 500 MG PO TABS: 500.0000 mg | ORAL_TABLET | Freq: Two times a day (BID) | ORAL | 0 refills | Status: DC

## 2018-05-13 MED ORDER — FLUCONAZOLE 150 MG PO TABS: 150.0000 mg | ORAL_TABLET | Freq: Once | ORAL | 0 refills | Status: AC

## 2018-05-13 NOTE — Patient Instructions (Signed)
Do not drink alcohol with the metronidazole.   Preventative Care for Adults - Female      MAINTAIN REGULAR HEALTH EXAMS:  A routine yearly physical is a good way to check in with your primary care provider about your health and preventive screening. It is also an opportunity to share updates about your health and any concerns you have, and receive a thorough all-over exam.   Most health insurance companies pay for at least some preventative services.  Check with your health plan for specific coverages.  WHAT PREVENTATIVE SERVICES DO WOMEN NEED?  Adult women should have their weight and blood pressure checked regularly.   Women age 22 and older should have their cholesterol levels checked regularly.  Women should be screened for cervical cancer with a Pap smear and pelvic exam beginning at age 22.  Breast cancer screening generally begins at age 22 with a mammogram and breast exam by your primary care provider.    Beginning at age 22 and continuing to age 22, women should be screened for colorectal cancer.  Certain people may need continued testing until age 22.  Updating vaccinations is part of preventative care.  Vaccinations help protect against diseases such as the flu.  Osteoporosis is a disease in which the bones lose minerals and strength as we age. Women ages 3165 and over should discuss this with their caregivers, as should women after menopause who have other risk factors.  Lab tests are generally done as part of preventative care to screen for anemia and blood disorders, to screen for problems with the kidneys and liver, to screen for bladder problems, to check blood sugar, and to check your cholesterol level.  Preventative services generally include counseling about diet, exercise, avoiding tobacco, drugs, excessive alcohol consumption, and sexually transmitted infections.    GENERAL RECOMMENDATIONS FOR GOOD HEALTH:  Healthy diet:  Eat a variety of foods, including fruit,  vegetables, animal or vegetable protein, such as meat, fish, chicken, and eggs, or beans, lentils, tofu, and grains, such as rice.  Drink plenty of water daily.  Decrease saturated fat in the diet, avoid lots of red meat, processed foods, sweets, fast foods, and fried foods.  Exercise:  Aerobic exercise helps maintain good heart health. At least 30-40 minutes of moderate-intensity exercise is recommended. For example, a brisk walk that increases your heart rate and breathing. This should be done on most days of the week.   Find a type of exercise or a variety of exercises that you enjoy so that it becomes a part of your daily life.  Examples are running, walking, swimming, water aerobics, and biking.  For motivation and support, explore group exercise such as aerobic class, spin class, Zumba, Yoga,or  martial arts, etc.    Set exercise goals for yourself, such as a certain weight goal, walk or run in a race such as a 5k walk/run.  Speak to your primary care provider about exercise goals.  Disease prevention:  If you smoke or chew tobacco, find out from your caregiver how to quit. It can literally save your life, no matter how long you have been a tobacco user. If you do not use tobacco, never begin.   Maintain a healthy diet and normal weight. Increased weight leads to problems with blood pressure and diabetes.   The Body Mass Index or BMI is a way of measuring how much of your body is fat. Having a BMI above 27 increases the risk of heart disease, diabetes, hypertension,  stroke and other problems related to obesity. Your caregiver can help determine your BMI and based on it develop an exercise and dietary program to help you achieve or maintain this important measurement at a healthful level.  High blood pressure causes heart and blood vessel problems.  Persistent high blood pressure should be treated with medicine if weight loss and exercise do not work.   Fat and cholesterol leaves  deposits in your arteries that can block them. This causes heart disease and vessel disease elsewhere in your body.  If your cholesterol is found to be high, or if you have heart disease or certain other medical conditions, then you may need to have your cholesterol monitored frequently and be treated with medication.   Ask if you should have a cardiac stress test if your history suggests this. A stress test is a test done on a treadmill that looks for heart disease. This test can find disease prior to there being a problem.  Menopause can be associated with physical symptoms and risks. Hormone replacement therapy is available to decrease these. You should talk to your caregiver about whether starting or continuing to take hormones is right for you.   Osteoporosis is a disease in which the bones lose minerals and strength as we age. This can result in serious bone fractures. Risk of osteoporosis can be identified using a bone density scan. Women ages 2265 and over should discuss this with their caregivers, as should women after menopause who have other risk factors. Ask your caregiver whether you should be taking a calcium supplement and Vitamin D, to reduce the rate of osteoporosis.   Avoid drinking alcohol in excess (more than two drinks per day).  Avoid use of street drugs. Do not share needles with anyone. Ask for professional help if you need assistance or instructions on stopping the use of alcohol, cigarettes, and/or drugs.  Brush your teeth twice a day with fluoride toothpaste, and floss once a day. Good oral hygiene prevents tooth decay and gum disease. The problems can be painful, unattractive, and can cause other health problems. Visit your dentist for a routine oral and dental check up and preventive care every 6-12 months.   Look at your skin regularly.  Use a mirror to look at your back. Notify your caregivers of changes in moles, especially if there are changes in shapes, colors, a size  larger than a pencil eraser, an irregular border, or development of new moles.  Safety:  Use seatbelts 100% of the time, whether driving or as a passenger.  Use safety devices such as hearing protection if you work in environments with loud noise or significant background noise.  Use safety glasses when doing any work that could send debris in to the eyes.  Use a helmet if you ride a bike or motorcycle.  Use appropriate safety gear for contact sports.  Talk to your caregiver about gun safety.  Use sunscreen with a SPF (or skin protection factor) of 15 or greater.  Lighter skinned people are at a greater risk of skin cancer. Don't forget to also wear sunglasses in order to protect your eyes from too much damaging sunlight. Damaging sunlight can accelerate cataract formation.   Practice safe sex. Use condoms. Condoms are used for birth control and to help reduce the spread of sexually transmitted infections (or STIs).  Some of the STIs are gonorrhea (the clap), chlamydia, syphilis, trichomonas, herpes, HPV (human papilloma virus) and HIV (human immunodeficiency virus) which causes  AIDS. The herpes, HIV and HPV are viral illnesses that have no cure. These can result in disability, cancer and death.   Keep carbon monoxide and smoke detectors in your home functioning at all times. Change the batteries every 6 months or use a model that plugs into the wall.   Vaccinations:  Stay up to date with your tetanus shots and other required immunizations. You should have a booster for tetanus every 10 years. Be sure to get your flu shot every year, since 5%-20% of the [place] population comes down with the flu. The flu vaccine changes each year, so being vaccinated once is not enough. Get your shot in the fall, before the flu season peaks.   Other vaccines to consider:  Human Papilloma Virus or HPV causes cancer of the cervix, and other infections that can be transmitted from person to person. There is a vaccine for  HPV, and females should get immunized between the ages of 9 and 64. It requires a series of 3 shots.   Pneumococcal vaccine to protect against certain types of pneumonia.  This is normally recommended for adults age 30 or older.  However, adults younger than 22 years old with certain underlying conditions such as diabetes, heart or lung disease should also receive the vaccine.  Shingles vaccine to protect against Varicella Zoster if you are older than age 15, or younger than 22 years old with certain underlying illness.  Hepatitis A vaccine to protect against a form of infection of the liver by a virus acquired from food.  Hepatitis B vaccine to protect against a form of infection of the liver by a virus acquired from blood or body fluids, particularly if you work in health care.  If you plan to travel internationally, check with your local health department for specific vaccination recommendations.  Cancer Screening:  Breast cancer screening is essential to preventive care for women. All women age 41 and older should perform a breast self-exam every month. At age 89 and older, women should have their caregiver complete a breast exam each year. Women at ages 68 and older should have a mammogram (x-ray film) of the breasts. Your caregiver can discuss how often you need mammograms.    Cervical cancer screening includes taking a Pap smear (sample of cells examined under a microscope) from the cervix (end of the uterus). It also includes testing for HPV (Human Papilloma Virus, which can cause cervical cancer). Screening and a pelvic exam should begin at age 16. Screening should occur every year, with a Pap smear but no HPV testing, up to age 47. After age 70, you should have a Pap smear every 3 years with HPV testing, if no HPV was found previously.   Most routine colon cancer screening begins at the age of 44. On a yearly basis, doctors may provide special easy to use take-home tests to check for  hidden blood in the stool. Sigmoidoscopy or colonoscopy can detect the earliest forms of colon cancer and is life saving. These tests use a small camera at the end of a tube to directly examine the colon. Speak to your caregiver about this at age 79, when routine screening begins (and is repeated every 5 years unless early forms of pre-cancerous polyps or small growths are found).

## 2018-05-14 LAB — HIV ANTIBODY (ROUTINE TESTING W REFLEX): HIV Screen 4th Generation wRfx: NONREACTIVE

## 2018-05-14 LAB — RPR: RPR Ser Ql: NONREACTIVE

## 2018-05-16 LAB — NUSWAB VAGINITIS PLUS (VG+)
CHLAMYDIA TRACHOMATIS, NAA: NEGATIVE
Candida albicans, NAA: POSITIVE — AB
Candida glabrata, NAA: NEGATIVE
NEISSERIA GONORRHOEAE, NAA: NEGATIVE
TRICH VAG BY NAA: NEGATIVE

## 2018-06-17 ENCOUNTER — Ambulatory Visit: Payer: BLUE CROSS/BLUE SHIELD | Admitting: Family Medicine

## 2018-09-28 ENCOUNTER — Telehealth: Payer: Self-pay

## 2018-09-28 NOTE — Telephone Encounter (Signed)
Error

## 2018-09-30 ENCOUNTER — Ambulatory Visit: Payer: BLUE CROSS/BLUE SHIELD | Admitting: Family Medicine

## 2018-10-30 ENCOUNTER — Ambulatory Visit: Payer: BLUE CROSS/BLUE SHIELD | Admitting: Family Medicine

## 2018-11-27 ENCOUNTER — Telehealth: Payer: Self-pay | Admitting: Family Medicine

## 2018-11-27 ENCOUNTER — Ambulatory Visit: Payer: BLUE CROSS/BLUE SHIELD | Admitting: Family Medicine

## 2018-11-27 NOTE — Telephone Encounter (Signed)
Dismissal letter in guarantor snapshot  °

## 2018-11-30 ENCOUNTER — Encounter: Payer: Self-pay | Admitting: Family Medicine

## 2018-11-30 ENCOUNTER — Ambulatory Visit (INDEPENDENT_AMBULATORY_CARE_PROVIDER_SITE_OTHER): Payer: BLUE CROSS/BLUE SHIELD | Admitting: Family Medicine

## 2018-11-30 VITALS — BP 110/60 | HR 69 | Temp 97.9°F | Resp 16 | Wt 112.2 lb

## 2018-11-30 DIAGNOSIS — N760 Acute vaginitis: Secondary | ICD-10-CM

## 2018-11-30 DIAGNOSIS — B9689 Other specified bacterial agents as the cause of diseases classified elsewhere: Secondary | ICD-10-CM

## 2018-11-30 DIAGNOSIS — B373 Candidiasis of vulva and vagina: Secondary | ICD-10-CM | POA: Diagnosis not present

## 2018-11-30 DIAGNOSIS — N898 Other specified noninflammatory disorders of vagina: Secondary | ICD-10-CM

## 2018-11-30 DIAGNOSIS — B3731 Acute candidiasis of vulva and vagina: Secondary | ICD-10-CM

## 2018-11-30 DIAGNOSIS — N926 Irregular menstruation, unspecified: Secondary | ICD-10-CM | POA: Diagnosis not present

## 2018-11-30 LAB — POCT WET PREP (WET MOUNT)
Clue Cells Wet Prep Whiff POC: POSITIVE
Trichomonas Wet Prep HPF POC: ABSENT

## 2018-11-30 LAB — POCT URINE PREGNANCY: PREG TEST UR: NEGATIVE

## 2018-11-30 MED ORDER — METRONIDAZOLE 500 MG PO TABS: 500.0000 mg | ORAL_TABLET | Freq: Two times a day (BID) | ORAL | 0 refills | Status: DC

## 2018-11-30 MED ORDER — FLUCONAZOLE 150 MG PO TABS: 150.0000 mg | ORAL_TABLET | Freq: Once | ORAL | 0 refills | Status: AC

## 2018-11-30 NOTE — Patient Instructions (Addendum)
Avoid alcohol with the metronidazole or it may make you sick.    Bacterial Vaginosis  Bacterial vaginosis is an infection of the vagina. It happens when too many normal germs (healthy bacteria) grow in the vagina. This infection puts you at risk for infections from sex (STIs). Treating this infection can lower your risk for some STIs. You should also treat this if you are pregnant. It can cause your baby to be born early. Follow these instructions at home: Medicines  Take over-the-counter and prescription medicines only as told by your doctor.  Take or use your antibiotic medicine as told by your doctor. Do not stop taking or using it even if you start to feel better. General instructions  If you your sexual partner is a woman, tell her that you have this infection. She needs to get treatment if she has symptoms. If you have a female partner, he does not need to be treated.  During treatment: ? Avoid sex. ? Do not douche. ? Avoid alcohol as told. ? Avoid breastfeeding as told.  Drink enough fluid to keep your pee (urine) clear or pale yellow.  Keep your vagina and butt (rectum) clean. ? Wash the area with warm water every day. ? Wipe from front to back after you use the toilet.  Keep all follow-up visits as told by your doctor. This is important. Preventing this condition  Do not douche.  Use only warm water to wash around your vagina.  Use protection when you have sex. This includes: ? Latex condoms. ? Dental dams.  Limit how many people you have sex with. It is best to only have sex with the same person (be monogamous).  Get tested for STIs. Have your partner get tested.  Wear underwear that is cotton or lined with cotton.  Avoid tight pants and pantyhose. This is most important in summer.  Do not use any products that have nicotine or tobacco in them. These include cigarettes and e-cigarettes. If you need help quitting, ask your doctor.  Do not use illegal  drugs.  Limit how much alcohol you drink. Contact a doctor if:  Your symptoms do not get better, even after you are treated.  You have more discharge or pain when you pee (urinate).  You have a fever.  You have pain in your belly (abdomen).  You have pain with sex.  Your bleed from your vagina between periods. Summary  This infection happens when too many germs (bacteria) grow in the vagina.  Treating this condition can lower your risk for some infections from sex (STIs).  You should also treat this if you are pregnant. It can cause early (premature) birth.  Do not stop taking or using your antibiotic medicine even if you start to feel better. This information is not intended to replace advice given to you by your health care provider. Make sure you discuss any questions you have with your health care provider. Document Released: 06/18/2008 Document Revised: 05/25/2016 Document Reviewed: 05/25/2016 Elsevier Interactive Patient Education  2019 Elsevier Inc.    Metronidazole tablets or capsules What is this medicine? METRONIDAZOLE (me troe NI da zole) is an antiinfective. It is used to treat certain kinds of bacterial and protozoal infections. It will not work for colds, flu, or other viral infections. This medicine may be used for other purposes; ask your health care provider or pharmacist if you have questions. COMMON BRAND NAME(S): Flagyl What should I tell my health care provider before I take this  medicine? They need to know if you have any of these conditions: -Cockayne syndrome -history of blood diseases, like sickle cell anemia or leukemia -history of yeast infection -if you often drink alcohol -liver disease -an unusual or allergic reaction to metronidazole, nitroimidazoles, or other medicines, foods, dyes, or preservatives -pregnant or trying to get pregnant -breast-feeding How should I use this medicine? Take this medicine by mouth with a full glass of water.  Follow the directions on the prescription label. Take your medicine at regular intervals. Do not take your medicine more often than directed. Take all of your medicine as directed even if you think you are better. Do not skip doses or stop your medicine early. Talk to your pediatrician regarding the use of this medicine in children. Special care may be needed. Overdosage: If you think you have taken too much of this medicine contact a poison control center or emergency room at once. NOTE: This medicine is only for you. Do not share this medicine with others. What if I miss a dose? If you miss a dose, take it as soon as you can. If it is almost time for your next dose, take only that dose. Do not take double or extra doses. What may interact with this medicine? Do not take this medicine with any of the following medications: -alcohol or any product that contains alcohol -cisapride -disulfiram -dofetilide -dronedarone -pimozide -thioridazine -ziprasidone This medicine may also interact with the following medications: -amiodarone -birth control pills -busulfan -carbamazepine -cimetidine -cyclosporine -fluorouracil -lithium -other medicines that prolong the QT interval (cause an abnormal heart rhythm) -phenobarbital -phenytoin -quinidine -tacrolimus -vecuronium -warfarin This list may not describe all possible interactions. Give your health care provider a list of all the medicines, herbs, non-prescription drugs, or dietary supplements you use. Also tell them if you smoke, drink alcohol, or use illegal drugs. Some items may interact with your medicine. What should I watch for while using this medicine? Tell your doctor or health care professional if your symptoms do not improve or if they get worse. You may get drowsy or dizzy. Do not drive, use machinery, or do anything that needs mental alertness until you know how this medicine affects you. Do not stand or sit up quickly, especially  if you are an older patient. This reduces the risk of dizzy or fainting spells. Ask your doctor or health care professional if you should avoid alcohol. Many nonprescription cough and cold products contain alcohol. Metronidazole can cause an unpleasant reaction when taken with alcohol. The reaction includes flushing, headache, nausea, vomiting, sweating, and increased thirst. The reaction can last from 30 minutes to several hours. If you are being treated for a sexually transmitted disease, avoid sexual contact until you have finished your treatment. Your sexual partner may also need treatment. What side effects may I notice from receiving this medicine? Side effects that you should report to your doctor or health care professional as soon as possible: -allergic reactions like skin rash or hives, swelling of the face, lips, or tongue -confusion -fast, irregular heartbeat -fever, chills, sore throat -fever with rash, swollen lymph nodes, or swelling of the face -pain, tingling, numbness in the hands or feet -redness, blistering, peeling or loosening of the skin, including inside the mouth -seizures -sign and symptoms of liver injury like dark yellow or brown urine; general ill feeling or flu-like symptoms; light colored stools; loss of appetite; nausea; right upper belly pain; unusually weak or tired; yellowing of the eyes or skin -vaginal  discharge, itching, or odor in women Side effects that usually do not require medical attention (report to your doctor or health care professional if they continue or are bothersome): -changes in taste -diarrhea -headache -nausea, vomiting -stomach pain This list may not describe all possible side effects. Call your doctor for medical advice about side effects. You may report side effects to FDA at 1-800-FDA-1088. Where should I keep my medicine? Keep out of the reach of children. Store at room temperature below 25 degrees C (77 degrees F). Protect from  light. Keep container tightly closed. Throw away any unused medicine after the expiration date. NOTE: This sheet is a summary. It may not cover all possible information. If you have questions about this medicine, talk to your doctor, pharmacist, or health care provider.  2019 Elsevier/Gold Standard (2016-12-11 20:55:23)

## 2018-11-30 NOTE — Progress Notes (Signed)
   Subjective:    Patient ID: Katie Pratt, female    DOB: Apr 01, 1996, 23 y.o.   MRN: [id number]  HPI Chief Complaint  Patient presents with  . vaginal issue    vaginal issue- been going on for a month. discharge, no odor, itchy   She is here with a 1 month history of vaginal discharge and itching.  Denies odor.  Last sexual encounter over a month ago.   LMP: end of January.  Not using birth control.  Denies fever, chills, abdominal pain, N/V/D, urinary symptoms.   Reviewed allergies, medications, past medical, surgical, family, and social history.   Review of Systems Pertinent positives and negatives in the history of present illness.     Objective:   Physical Exam BP 110/60   Pulse 69   Temp 97.9 F (36.6 C) (Oral)   Resp 16   Wt 112 lb 3.2 oz (50.9 kg)   LMP 10/20/2018 (Approximate)   SpO2 98%   BMI 21.91 kg/m   Speculum exam performed and chaperone present. Thick, white, clumpy discharge with erythema of vagina.      Assessment & Plan:  Vaginal discharge - Plan: POCT Wet Prep Mellody [place]), NuSwab Vaginitis Plus (VG+)  Late menses - Plan: POCT urine pregnancy  BV (bacterial vaginosis) - Plan: metroNIDAZOLE (FLAGYL) 500 MG tablet  Candida vaginitis - Plan: fluconazole (DIFLUCAN) 150 MG tablet  UPT negative. No recent sexual activity.  Wet mount +BV, yeast.  Vaginitis swab sent.  Treat with metronidazole and diflucan. Avoid alcohol with metronidazole.

## 2018-12-03 LAB — NUSWAB VAGINITIS PLUS (VG+)
CHLAMYDIA TRACHOMATIS, NAA: NEGATIVE
Candida albicans, NAA: POSITIVE — AB
Candida glabrata, NAA: NEGATIVE
Neisseria gonorrhoeae, NAA: NEGATIVE
Trich vag by NAA: NEGATIVE

## 2019-05-17 ENCOUNTER — Encounter: Payer: BLUE CROSS/BLUE SHIELD | Admitting: Family Medicine

## 2019-05-17 ENCOUNTER — Telehealth: Payer: Self-pay | Admitting: Internal Medicine

## 2019-05-17 NOTE — Telephone Encounter (Signed)

## 2019-05-18 ENCOUNTER — Encounter: Payer: Self-pay | Admitting: Family Medicine

## 2019-05-18 NOTE — Telephone Encounter (Signed)
To Shauna  °

## 2019-05-18 NOTE — Telephone Encounter (Signed)
E. No show fee for CPE

## 2019-07-02 ENCOUNTER — Encounter: Payer: Self-pay | Admitting: Family Medicine

## 2019-07-02 ENCOUNTER — Ambulatory Visit (INDEPENDENT_AMBULATORY_CARE_PROVIDER_SITE_OTHER): Payer: BC Managed Care – PPO | Admitting: Family Medicine

## 2019-07-02 ENCOUNTER — Other Ambulatory Visit: Payer: Self-pay

## 2019-07-02 VITALS — BP 110/70 | HR 93 | Temp 97.5°F | Ht 60.5 in | Wt 120.0 lb

## 2019-07-02 DIAGNOSIS — Z113 Encounter for screening for infections with a predominantly sexual mode of transmission: Secondary | ICD-10-CM

## 2019-07-02 DIAGNOSIS — N926 Irregular menstruation, unspecified: Secondary | ICD-10-CM

## 2019-07-02 DIAGNOSIS — Z0000 Encounter for general adult medical examination without abnormal findings: Secondary | ICD-10-CM

## 2019-07-02 DIAGNOSIS — R82998 Other abnormal findings in urine: Secondary | ICD-10-CM

## 2019-07-02 DIAGNOSIS — N898 Other specified noninflammatory disorders of vagina: Secondary | ICD-10-CM

## 2019-07-02 LAB — POCT URINALYSIS DIP (PROADVANTAGE DEVICE)
Bilirubin, UA: NEGATIVE
Glucose, UA: NEGATIVE mg/dL
Ketones, POC UA: NEGATIVE mg/dL
Nitrite, UA: NEGATIVE
Protein Ur, POC: NEGATIVE mg/dL
Specific Gravity, Urine: 1.02
Urobilinogen, Ur: NEGATIVE
pH, UA: 6 (ref 5.0–8.0)

## 2019-07-02 LAB — POCT URINE PREGNANCY: Preg Test, Ur: NEGATIVE

## 2019-07-02 NOTE — Progress Notes (Signed)
Subjective:    Patient ID: Katie Pratt, female    DOB: [birth year], 23 y.o.   MRN: [id number]  HPI Chief Complaint  Patient presents with  . cpe    cpe, nauseated and couldn't eat much back in august. and no period since august.  some discharge. declines flu shot. pap was done in 2018- normal   She is here for a complete physical exam. Last CPE: 05/13/2018  Other providers: None   States her period is late, missed having her cycle in September.  States she is not using birth control.  States she had a negative pregnancy test at home.  Reports having nausea in August but this resolved. Denies having abdominal pain, back pain, changes in bowel habits, urinary symptoms. She questions having vaginal discharge and is not sure.  Denies odor or itching   Social history: Lives with her parents, has degree from [employer] in therapeutic recreation and minor in psych Denies smoking, drinking alcohol, drug use  Diet: healthy well balanced Excerise: none   Immunizations: Tdap up-to-date.  HPV series complete  Health maintenance:  Last Gynecological Exam: Last year Last Menstrual cycle: 05/08/2019 Pregnancies: 0 Last Dental Exam: Dr. Radford Pax- twice annually. Orthodontist is Dr. Marty Heck  Last Eye Exam: last year. Has glasses   Wears seatbelt always, uses sunscreen, smoke detectors in home and functioning, does not text while driving and feels safe in home environment.   Reviewed allergies, medications, past medical, surgical, family, and social history.     Review of Systems  Review of Systems Constitutional: -fever, -chills, -sweats, -unexpected weight change,-fatigue ENT: -runny nose, -ear pain, -sore throat Cardiology:  -chest pain, -palpitations, -edema Respiratory: -cough, -shortness of breath, -wheezing Gastroenterology: -abdominal pain, -nausea, -vomiting, -diarrhea, -constipation  Hematology: -bleeding or bruising problems Musculoskeletal: -arthralgias, -myalgias, -joint  swelling, -back pain Ophthalmology: -vision changes Urology: -dysuria, -difficulty urinating, -hematuria, -urinary frequency, -urgency Neurology: -headache, -weakness, -tingling, -numbness       Objective:   Physical Exam BP 110/70   Pulse 93   Temp (!) 97.5 F (36.4 C)   Ht 5' 0.5" (1.537 m)   Wt 120 lb (54.4 kg)   LMP 05/08/2019   BMI 23.05 kg/m   General Appearance:    Alert, cooperative, no distress, appears stated age  Head:    Normocephalic, without obvious abnormality, atraumatic  Eyes:    PERRL, conjunctiva/corneas clear, EOM's intact, fundi    benign  Ears:    Normal TM's and external ear canals  Nose:  Mask in place  Throat:  Mask in place  Neck:   Supple, no lymphadenopathy;  thyroid:  no   enlargement/tenderness/nodules; no carotid   bruit or JVD  Back:    Spine nontender, no curvature, ROM normal, no CVA     tenderness  Lungs:     Clear to auscultation bilaterally without wheezes, rales or     ronchi; respirations unlabored  Chest Wall:    No tenderness or deformity   Heart:    Regular rate and rhythm, S1 and S2 normal, no murmur, rub   or gallop  Breast Exam:   Deferred  Abdomen:     Soft, non-tender, nondistended, normoactive bowel sounds,    no masses, no hepatosplenomegaly  Genitalia:    Normal external genitalia without lesions.  BUS and vagina normal except for white, thick discharge.  Pap not indicated.  Chaperone present  Rectal:    Not performed due to age<40 and no related complaints  Extremities:  No clubbing, cyanosis or edema  Pulses:   2+ and symmetric all extremities  Skin:   Skin color, texture, turgor normal, no rashes or lesions  Lymph nodes:   Cervical, supraclavicular, and axillary nodes normal  Neurologic:   CNII-XII intact, normal strength, sensation and gait; reflexes 2+ and symmetric throughout          Psych:   Normal mood, affect, hygiene and grooming.         Assessment & Plan:  Routine general medical examination at a health  care facility - Plan: POCT Urinalysis DIP (Proadvantage Device), CBC with Differential/Platelet, Comprehensive metabolic panel, TSH, Lipid panel -Discussed preventive healthcare.  She is up-to-date on Pap smear.  Counseling on immunizations.  Declines flu shot. Up to date otherwise.  Discussed safety.  Declines contraception.   Missed period - Plan: POCT urine pregnancy, Beta hCG quant (ref lab) -negative UPT. Check labs.   Screen for STD (sexually transmitted disease) - Plan: NuSwab Vaginitis Plus (VG+), HIV Antibody (routine testing w rflx), RPR - advised to use condoms for safe sex. Follow up pending results.   Vaginal discharge - Plan: NuSwab Vaginitis Plus (VG+) -she is asymptomatic. Follow up pending results.   Leukocytes in urine - Plan: Urine Culture -asymptomatic. Follow up pending result.

## 2019-07-02 NOTE — Patient Instructions (Signed)
Preventive Care 18-23 Years Old, Female Preventive care refers to lifestyle choices and visits with your health care provider that can promote health and wellness. At this stage in your life, you may start seeing a primary care physician instead of a pediatrician. Your health care is now your responsibility. Preventive care for young adults includes:  A yearly physical exam. This is also called an annual wellness visit.  Regular dental and eye exams.  Immunizations.  Screening for certain conditions.  Healthy lifestyle choices, such as diet and exercise. What can I expect for my preventive care visit? Physical exam Your health care provider may check:  Height and weight. These may be used to calculate body mass index (BMI), which is a measurement that tells if you are at a healthy weight.  Heart rate and blood pressure.  Body temperature. Counseling Your health care provider may ask you questions about:  Past medical problems and family medical history.  Alcohol, tobacco, and drug use.  Home and relationship well-being.  Access to firearms.  Emotional well-being.  Diet, exercise, and sleep habits.  Sexual activity and sexual health.  Method of birth control.  Menstrual cycle.  Pregnancy history. What immunizations do I need?  Influenza (flu) vaccine  This is recommended every year. Tetanus, diphtheria, and pertussis (Tdap) vaccine  You may need a Td booster every 10 years. Varicella (chickenpox) vaccine  You may need this vaccine if you have not already been vaccinated. Human papillomavirus (HPV) vaccine  If recommended by your health care provider, you may need three doses over 6 months. Measles, mumps, and rubella (MMR) vaccine  You may need at least one dose of MMR. You may also need a second dose. Meningococcal conjugate (MenACWY) vaccine  One dose is recommended if you are 19-23 years old and a first-year college student living in a residence hall,  or if you have one of several medical conditions. You may also need additional booster doses. Pneumococcal conjugate (PCV13) vaccine  You may need this if you have certain conditions and were not previously vaccinated. Pneumococcal polysaccharide (PPSV23) vaccine  You may need one or two doses if you smoke cigarettes or if you have certain conditions. Hepatitis A vaccine  You may need this if you have certain conditions or if you travel or work in places where you may be exposed to hepatitis A. Hepatitis B vaccine  You may need this if you have certain conditions or if you travel or work in places where you may be exposed to hepatitis B. Haemophilus influenzae type b (Hib) vaccine  You may need this if you have certain risk factors. You may receive vaccines as individual doses or as more than one vaccine together in one shot (combination vaccines). Talk with your health care provider about the risks and benefits of combination vaccines. What tests do I need? Blood tests  Lipid and cholesterol levels. These may be checked every 5 years starting at age 20.  Hepatitis C test.  Hepatitis B test. Screening  Pelvic exam and Pap test. This may be done every 3 years starting at age 23.  Sexually transmitted disease (STD) testing, if you are at risk.  BRCA-related cancer screening. This may be done if you have a family history of breast, ovarian, tubal, or peritoneal cancers. Other tests  Tuberculosis skin test.  Vision and hearing tests.  Skin exam.  Breast exam. Follow these instructions at home: Eating and drinking   Eat a diet that includes fresh fruits and   vegetables, whole grains, lean protein, and low-fat dairy products.  Drink enough fluid to keep your urine pale yellow.  Do not drink alcohol if: ? Your health care provider tells you not to drink. ? You are pregnant, may be pregnant, or are planning to become pregnant. ? You are under the legal drinking age. In the  [place] the legal drinking age is 21.  If you drink alcohol: ? Limit how much you have to 0-1 drink a day. ? Be aware of how much alcohol is in your drink. In the [place] one drink equals one 12 oz bottle of beer (355 mL), one 5 oz glass of wine (148 mL), or one 1 oz glass of hard liquor (44 mL). Lifestyle  Take daily care of your teeth and gums.  Stay active. Exercise at least 30 minutes 5 or more days of the week.  Do not use any products that contain nicotine or tobacco, such as cigarettes, e-cigarettes, and chewing tobacco. If you need help quitting, ask your health care provider.  Do not use drugs.  If you are sexually active, practice safe sex. Use a condom or other form of birth control (contraception) in order to prevent pregnancy and STIs (sexually transmitted infections). If you plan to become pregnant, see your health care provider for a pre-conception visit.  Find healthy ways to cope with stress, such as: ? Meditation, yoga, or listening to music. ? Journaling. ? Talking to a trusted person. ? Spending time with friends and family. Safety  Always wear your seat belt while driving or riding in a vehicle.  Do not drive if you have been drinking alcohol. Do not ride with someone who has been drinking.  Do not drive when you are tired or distracted. Do not text while driving.  Wear a helmet and other protective equipment during sports activities.  If you have firearms in your house, make sure you follow all gun safety procedures.  Seek help if you have been bullied, physically abused, or sexually abused.  Use the Internet responsibly to avoid dangers such as online bullying and online sex predators. What's next?  Go to your health care provider once a year for a well check visit.  Ask your health care provider how often you should have your eyes and teeth checked.  Stay up to date on all vaccines. This information is not intended to replace advice given to you by  your health care provider. Make sure you discuss any questions you have with your health care provider. Document Released: 01/25/2016 Document Revised: 09/03/2018 Document Reviewed: 09/03/2018 Elsevier Patient Education  2020 Elsevier Inc.  

## 2019-07-03 LAB — COMPREHENSIVE METABOLIC PANEL
ALT: 10 IU/L (ref 0–32)
AST: 18 IU/L (ref 0–40)
Albumin/Globulin Ratio: 1.5 (ref 1.2–2.2)
Albumin: 4.4 g/dL (ref 3.9–5.0)
Alkaline Phosphatase: 94 IU/L (ref 39–117)
BUN/Creatinine Ratio: 12 (ref 9–23)
BUN: 8 mg/dL (ref 6–20)
Bilirubin Total: 0.4 mg/dL (ref 0.0–1.2)
CO2: 21 mmol/L (ref 20–29)
Calcium: 9.1 mg/dL (ref 8.7–10.2)
Chloride: 106 mmol/L (ref 96–106)
Creatinine, Ser: 0.67 mg/dL (ref 0.57–1.00)
GFR calc Af Amer: 143 mL/min/{1.73_m2} (ref >59)
GFR calc non Af Amer: 124 mL/min/{1.73_m2} (ref >59)
Globulin, Total: 3 g/dL (ref 1.5–4.5)
Glucose: 91 mg/dL (ref 65–99)
Potassium: 4.4 mmol/L (ref 3.5–5.2)
Sodium: 140 mmol/L (ref 134–144)
Total Protein: 7.4 g/dL (ref 6.0–8.5)

## 2019-07-03 LAB — CBC WITH DIFFERENTIAL/PLATELET
Basophils Absolute: 0 10*3/uL (ref 0.0–0.2)
Basos: 1 %
EOS (ABSOLUTE): 0.1 10*3/uL (ref 0.0–0.4)
Eos: 1 %
Hematocrit: 40.5 % (ref 34.0–46.6)
Hemoglobin: 13.7 g/dL (ref 11.1–15.9)
Immature Grans (Abs): 0 10*3/uL (ref 0.0–0.1)
Immature Granulocytes: 0 %
Lymphocytes Absolute: 2 10*3/uL (ref 0.7–3.1)
Lymphs: 46 %
MCH: 29.9 pg (ref 26.6–33.0)
MCHC: 33.8 g/dL (ref 31.5–35.7)
MCV: 88 fL (ref 79–97)
Monocytes Absolute: 0.3 10*3/uL (ref 0.1–0.9)
Monocytes: 6 %
Neutrophils Absolute: 2 10*3/uL (ref 1.4–7.0)
Neutrophils: 46 %
Platelets: 254 10*3/uL (ref 150–450)
RBC: 4.58 x10E6/uL (ref 3.77–5.28)
RDW: 12.6 % (ref 11.7–15.4)
WBC: 4.4 10*3/uL (ref 3.4–10.8)

## 2019-07-03 LAB — TSH: TSH: 0.668 u[IU]/mL (ref 0.450–4.500)

## 2019-07-03 LAB — BETA HCG QUANT (REF LAB): hCG Quant: <1 m[IU]/mL

## 2019-07-03 LAB — LIPID PANEL
Chol/HDL Ratio: 2.2 ratio (ref 0.0–4.4)
Cholesterol, Total: 121 mg/dL (ref 100–199)
HDL: 56 mg/dL (ref >39)
LDL Chol Calc (NIH): 57 mg/dL (ref 0–99)
Triglycerides: 23 mg/dL (ref 0–149)
VLDL Cholesterol Cal: 8 mg/dL (ref 5–40)

## 2019-07-03 LAB — RPR: RPR Ser Ql: NONREACTIVE

## 2019-07-03 LAB — HIV ANTIBODY (ROUTINE TESTING W REFLEX): HIV Screen 4th Generation wRfx: NONREACTIVE

## 2019-07-04 LAB — URINE CULTURE

## 2019-07-05 ENCOUNTER — Other Ambulatory Visit: Payer: Self-pay | Admitting: Internal Medicine

## 2019-07-05 LAB — NUSWAB VAGINITIS PLUS (VG+)
Candida albicans, NAA: POSITIVE — AB
Candida glabrata, NAA: NEGATIVE
Chlamydia trachomatis, NAA: NEGATIVE
Neisseria gonorrhoeae, NAA: NEGATIVE
Trich vag by NAA: NEGATIVE

## 2019-07-05 MED ORDER — FLUCONAZOLE 150 MG PO TABS: 150.0000 mg | ORAL_TABLET | Freq: Once | ORAL | 0 refills | Status: AC

## 2019-07-19 ENCOUNTER — Ambulatory Visit: Payer: BC Managed Care – PPO | Admitting: Family Medicine

## 2019-07-22 ENCOUNTER — Encounter: Payer: Self-pay | Admitting: Family Medicine

## 2019-07-22 ENCOUNTER — Other Ambulatory Visit: Payer: Self-pay

## 2019-07-22 ENCOUNTER — Ambulatory Visit (INDEPENDENT_AMBULATORY_CARE_PROVIDER_SITE_OTHER): Payer: BC Managed Care – PPO | Admitting: Family Medicine

## 2019-07-22 VITALS — BP 120/70 | HR 68 | Temp 98.0°F | Wt 119.8 lb

## 2019-07-22 DIAGNOSIS — R82998 Other abnormal findings in urine: Secondary | ICD-10-CM | POA: Diagnosis not present

## 2019-07-22 LAB — POCT URINALYSIS DIP (PROADVANTAGE DEVICE)
Glucose, UA: NEGATIVE mg/dL
Nitrite, UA: NEGATIVE
Protein Ur, POC: NEGATIVE mg/dL
Specific Gravity, Urine: 1.02
Urobilinogen, Ur: NEGATIVE
pH, UA: 6 (ref 5.0–8.0)

## 2019-07-22 NOTE — Progress Notes (Signed)
   Subjective:    Patient ID: Katie Pratt, female    DOB: [birth year], 23 y.o.   MRN: [id number]  HPI Chief Complaint  Patient presents with  . follow-up on urine    follow-up on urine. not having an symptoms   Here today to follow up on abnormal urine. She had blood and leukocytes at her CPE and was asymptomatic. She reports having her period 2 weeks ago. Denies any urinary symptoms. Feels good.   Does not drink water often. Has not eaten or had water today. Drinks juice mostly.   Denies fever, chills, abdominal pain, back pain, N/V/D.   Reviewed allergies, medications, past medical, surgical, family, and social history.    Review of Systems Pertinent positives and negatives in the history of present illness.     Objective:   Physical Exam BP 120/70   Pulse 68   Temp 98 F (36.7 C)   Wt 119 lb 12.8 oz (54.3 kg)   BMI 23.01 kg/m   Alert and oriented and in no acute distress. Not otherwise examined.       Assessment & Plan:  Leukocytes in urine - Plan: Urine Culture, POCT Urinalysis DIP (Proadvantage Device), Urine Microscopic  Dark urine - Plan: POCT Urinalysis DIP (Proadvantage Device)  Negative urine culture at her previous visit.  Asymptomatic.  UA no longer shows blood. +leukocytes, bilirubin, ketones.  Suspect contaminated specimen. Will send for microscopy to look for epithelial cells.  Encouraged her to hydrate, 6-8 glasses of water per day and avoid skipping meals.  Follow up for urine only in 2-3 weeks if urine is not contaminated or if UTI present.

## 2019-07-23 LAB — URINALYSIS, MICROSCOPIC ONLY
Bacteria, UA: NONE SEEN
Casts: NONE SEEN /lpf
Epithelial Cells (non renal): >10 /hpf — AB (ref 0–10)

## 2019-07-25 ENCOUNTER — Other Ambulatory Visit: Payer: Self-pay | Admitting: Family Medicine

## 2019-07-25 DIAGNOSIS — N3000 Acute cystitis without hematuria: Secondary | ICD-10-CM

## 2019-07-25 LAB — URINE CULTURE

## 2019-07-25 MED ORDER — SULFAMETHOXAZOLE-TRIMETHOPRIM 800-160 MG PO TABS: 1.0000 | ORAL_TABLET | Freq: Two times a day (BID) | ORAL | 0 refills | Status: DC

## 2019-07-26 ENCOUNTER — Other Ambulatory Visit: Payer: Self-pay | Admitting: Family Medicine

## 2019-07-26 DIAGNOSIS — R82998 Other abnormal findings in urine: Secondary | ICD-10-CM

## 2019-08-02 ENCOUNTER — Other Ambulatory Visit: Payer: Self-pay

## 2019-08-02 DIAGNOSIS — Z20822 Contact with and (suspected) exposure to covid-19: Secondary | ICD-10-CM

## 2019-08-03 LAB — NOVEL CORONAVIRUS, NAA: SARS-CoV-2, NAA: NOT DETECTED

## 2019-08-09 ENCOUNTER — Telehealth: Payer: Self-pay | Admitting: Family Medicine

## 2019-08-09 DIAGNOSIS — L309 Dermatitis, unspecified: Secondary | ICD-10-CM

## 2019-08-09 NOTE — Telephone Encounter (Signed)
Ok to give her a refill and make sure she knows to never use this steroid for more than 2 weeks continuously.

## 2019-08-09 NOTE — Telephone Encounter (Signed)
Pt called and wants to see if she could get a refill on her Temovate for her Ezema sent to the [facility] on [address]

## 2019-08-10 MED ORDER — CLOBETASOL PROPIONATE 0.05 % EX CREA: 1.0000 "application " | TOPICAL_CREAM | Freq: Two times a day (BID) | CUTANEOUS | 0 refills | Status: DC

## 2019-08-10 NOTE — Telephone Encounter (Signed)
Sent med to pharmacy and left message for pt to not use med more than 2 weeks straight

## 2019-09-24 NOTE — L&D Delivery Note (Signed)
Delivery Note:   G1P0 at [redacted]w[redacted]d  Admitting diagnosis: Preterm contractions [O47.9] Risks: Preterm, hyperthyroidism, heart palpitations Onset of labor: 04/29/20 @ 1621 IOL/Augmentation: AROM ROM: AROM clear fluid 04/29/20 @ 2202  Complete dilation at 04/30/2020  0115 Onset of pushing at 0115 FHR second stage Cat 1  Analgesia /Anesthesia intrapartum:Local  Pushing in hands and knees position with CNM and L&D staff support, FOB and mother present for birth and supportive.  Delivery of a Live born female  Birth Weight:   APGAR: 8, 9   Newborn Delivery   Birth date/time: 04/30/2020 01:27:00 Delivery type: Vaginal, Spontaneous      in cephalic presentation, position OA to ROA.  APGAR:1 min- 8, 5 min- 9 Nuchal Cord: No  Cord double clamped after cessation of pulsation, cut by FOB.  Collection of cord blood for typing completed. Cord blood donation-None  Arterial cord blood sample-No    Placenta delivered-Spontaneous;Pathology  with 3 vessels . Uterotonics: Pitocin Placenta to pathology for prematurity. Uterine tone firm bleeding stable  1st degree  laceration identified.  Episiotomy:None  Local analgesia: Lidocaine  Repair: 3-0 vicryl in usual fashion with excellent hemostasis Est. Blood Loss (mL): 325cc Complications: None  Mom to postpartum.  Baby Katie Pratt to Couplet care / Skin to Skin.  Delivery Report:  Review the Delivery Report for details.     Signed: Clancy Gourd, MSN 04/30/2020, 2:15 AM

## 2019-10-13 LAB — HM PAP SMEAR

## 2019-11-03 ENCOUNTER — Other Ambulatory Visit: Payer: Self-pay

## 2019-11-03 ENCOUNTER — Encounter (HOSPITAL_COMMUNITY): Payer: Self-pay | Admitting: Obstetrics & Gynecology

## 2019-11-03 ENCOUNTER — Inpatient Hospital Stay (HOSPITAL_COMMUNITY)
Admission: EM | Admit: 2019-11-03 | Discharge: 2019-11-03 | Disposition: A | Discharge status: Home / Self Care | Payer: BC Managed Care – PPO | Attending: Obstetrics & Gynecology | Admitting: Obstetrics & Gynecology

## 2019-11-03 DIAGNOSIS — O26891 Other specified pregnancy related conditions, first trimester: Secondary | ICD-10-CM | POA: Diagnosis present

## 2019-11-03 DIAGNOSIS — Z3A10 10 weeks gestation of pregnancy: Secondary | ICD-10-CM | POA: Insufficient documentation

## 2019-11-03 DIAGNOSIS — O219 Vomiting of pregnancy, unspecified: Secondary | ICD-10-CM

## 2019-11-03 DIAGNOSIS — R112 Nausea with vomiting, unspecified: Secondary | ICD-10-CM | POA: Insufficient documentation

## 2019-11-03 LAB — URINALYSIS, ROUTINE W REFLEX MICROSCOPIC
Bilirubin Urine: NEGATIVE
Glucose, UA: NEGATIVE mg/dL
Ketones, ur: 80 mg/dL — AB
Nitrite: NEGATIVE
Protein, ur: 30 mg/dL — AB
Specific Gravity, Urine: 1.029 (ref 1.005–1.030)
pH: 6 (ref 5.0–8.0)

## 2019-11-03 LAB — POCT PREGNANCY, URINE: Preg Test, Ur: POSITIVE — AB

## 2019-11-03 MED ORDER — LACTATED RINGERS IV BOLUS
1000.0000 mL | Freq: Once | INTRAVENOUS | Status: AC
  Administered 2019-11-03: 14:00:00 1000 mL via INTRAVENOUS

## 2019-11-03 MED ORDER — METOCLOPRAMIDE HCL 10 MG PO TABS: 10.0000 mg | ORAL_TABLET | Freq: Three times a day (TID) | ORAL | 0 refills | Status: DC | PRN

## 2019-11-03 MED ORDER — FAMOTIDINE IN NACL 20-0.9 MG/50ML-% IV SOLN
20.0000 mg | Freq: Once | INTRAVENOUS | Status: AC
  Administered 2019-11-03: 14:00:00 20 mg via INTRAVENOUS
  Filled 2019-11-03: qty 50

## 2019-11-03 MED ORDER — PROMETHAZINE HCL 25 MG/ML IJ SOLN
25.0000 mg | Freq: Once | INTRAMUSCULAR | Status: AC
  Administered 2019-11-03: 25 mg via INTRAVENOUS
  Filled 2019-11-03: qty 1

## 2019-11-03 NOTE — Discharge Instructions (Signed)
How to take diclegis Start with 2 tablets every evening. If symptoms persist, add 1 tablet every morning. If symptoms persist, add 1 tablet at mid-day.      Morning Sickness  Morning sickness is when a woman feels nauseous during pregnancy. This nauseous feeling may or may not come with vomiting. It often occurs in the morning, but it can be a problem at any time of day. Morning sickness is most common during the first trimester. In some cases, it may continue throughout pregnancy. Although morning sickness is unpleasant, it is usually harmless unless the woman develops severe and continual vomiting (hyperemesis gravidarum), a condition that requires more intense treatment. What are the causes? The exact cause of this condition is not known, but it seems to be related to normal hormonal changes that occur in pregnancy. What increases the risk? You are more likely to develop this condition if:  You experienced nausea or vomiting before your pregnancy.  You had morning sickness during a previous pregnancy.  You are pregnant with more than one baby, such as twins. What are the signs or symptoms? Symptoms of this condition include:  Nausea.  Vomiting. How is this diagnosed? This condition is usually diagnosed based on your signs and symptoms. How is this treated? In many cases, treatment is not needed for this condition. Making some changes to what you eat may help to control symptoms. Your health care provider may also prescribe or recommend:  Vitamin B6 supplements.  Anti-nausea medicines.  Ginger. Follow these instructions at home: Medicines  Take over-the-counter and prescription medicines only as told by your health care provider. Do not use any prescription, over-the-counter, or herbal medicines for morning sickness without first talking with your health care provider.  Taking multivitamins before getting pregnant can prevent or decrease the severity of morning sickness in  most women. Eating and drinking  Eat a piece of dry toast or crackers before getting out of bed in the morning.  Eat 5 or 6 small meals a day.  Eat dry and bland foods, such as rice or a baked potato. Foods that are high in carbohydrates are often helpful.  Avoid greasy, fatty, and spicy foods.  Have someone cook for you if the smell of any food causes nausea and vomiting.  If you feel nauseous after taking prenatal vitamins, take the vitamins at night or with a snack.  Snack on protein foods between meals if you are hungry. Nuts, yogurt, and cheese are good options.  Drink fluids throughout the day.  Try ginger ale made with real ginger, ginger tea made from fresh grated ginger, or ginger candies. General instructions  Do not use any products that contain nicotine or tobacco, such as cigarettes and e-cigarettes. If you need help quitting, ask your health care provider.  Get an air purifier to keep the air in your house free of odors.  Get plenty of fresh air.  Try to avoid odors that trigger your nausea.  Consider trying these methods to help relieve symptoms: ? Wearing an acupressure wristband. These wristbands are often worn for seasickness. ? Acupuncture. Contact a health care provider if:  Your home remedies are not working and you need medicine.  You feel dizzy or light-headed.  You are losing weight. Get help right away if:  You have persistent and uncontrolled nausea and vomiting.  You faint.  You have severe pain in your abdomen. Summary  Morning sickness is when a woman feels nauseous during pregnancy. This nauseous feeling   may or may not come with vomiting.  Morning sickness is most common during the first trimester.  It often occurs in the morning, but it can be a problem at any time of day.  In many cases, treatment is not needed for this condition. Making some changes to what you eat may help to control symptoms. This information is not intended  to replace advice given to you by your health care provider. Make sure you discuss any questions you have with your health care provider. Document Revised: 08/22/2017 Document Reviewed: 10/12/2016 Elsevier Patient Education  2020 Elsevier Inc.  

## 2019-11-03 NOTE — MAU Note (Signed)
Pt was sent from [facility] ED for nausea and vomiting for last 3 weeks. Unable to eat much. Has vomited 4 times in last 24 hours.

## 2019-11-03 NOTE — ED Provider Notes (Signed)
I personally saw and evaluated patient and recommended transfer to MAU.  Discussed case with Erin APP/NP at MAU who recommends transfer and will accept patient.  Vital signs are within normal limits.  Patient is stable for transfer at this time.   Katie Pratt, [place] 11/03/19 1037    Terrilee Files, MD 11/03/19 [phone]

## 2019-11-03 NOTE — MAU Provider Note (Signed)
Chief Complaint: Nausea and Emesis   First Provider Initiated Contact with Patient 11/03/19 1325     SUBJECTIVE HPI: Katie Pratt is a 24 y.o. G1P0 at [redacted]w[redacted]d who presents to [facility] reporting nausea & vomiting. Symptoms ongoing for 3 weeks. Has been taking diclegis without relief. Last dose was a few days ago. Has vomited 4 times in the last 24 hours and states she's unable to keep down solid foods. Denies abdominal pain, vaginal bleeding, diarrhea, or fever. Goes to [facility] & has appointment with them on Friday.    Past Medical History:  Diagnosis Date  . Anemia   . Eczema    OB History  Gravida Para Term Preterm AB Living  1            SAB TAB Ectopic Multiple Live Births               # Outcome Date GA Lbr Len/2nd Weight Sex Delivery Anes PTL Lv  1 Current            Past Surgical History:  Procedure Laterality Date  . NO PAST SURGERIES     Social History   Socioeconomic History  . Marital status: Single    Spouse name: Not on file  . Number of children: Not on file  . Years of education: Not on file  . Highest education level: Not on file  Occupational History  . Not on file  Tobacco Use  . Smoking status: Never Smoker  . Smokeless tobacco: Never Used  Substance and Sexual Activity  . Alcohol use: No  . Drug use: No  . Sexual activity: Yes    Birth control/protection: None  Other Topics Concern  . Not on file  Social History Narrative  . Not on file   Social Determinants of Health   Financial Resource Strain:   . Difficulty of Paying Living Expenses: Not on file  Food Insecurity:   . Worried About Programme researcher, broadcasting/film/video in the Last Year: Not on file  . Ran Out of Food in the Last Year: Not on file  Transportation Needs:   . Lack of Transportation (Medical): Not on file  . Lack of Transportation (Non-Medical): Not on file  Physical Activity:   . Days of Exercise per Week: Not on file  . Minutes of Exercise per Session: Not on file  Stress:    . Feeling of Stress : Not on file  Social Connections:   . Frequency of Communication with Friends and Family: Not on file  . Frequency of Social Gatherings with Friends and Family: Not on file  . Attends Religious Services: Not on file  . Active Member of Clubs or Organizations: Not on file  . Attends Banker Meetings: Not on file  . Marital Status: Not on file  Intimate Partner Violence:   . Fear of Current or Ex-Partner: Not on file  . Emotionally Abused: Not on file  . Physically Abused: Not on file  . Sexually Abused: Not on file   Family History  Problem Relation Age of Onset  . AAA (abdominal aortic aneurysm) Mother   . Diabetes Maternal Grandmother   . Heart failure Maternal Grandmother   . Obesity Maternal Grandmother   . Arthritis Maternal Grandmother    No current facility-administered medications on file prior to encounter.   No current outpatient medications on file prior to encounter.   No Known Allergies  I have reviewed patient's Past Medical Hx, Surgical Hx,  Family Hx, Social Hx, medications and allergies.   Review of Systems  Constitutional: Negative.   Gastrointestinal: Positive for nausea and vomiting. Negative for abdominal pain and diarrhea.  Genitourinary: Negative.     OBJECTIVE Patient Vitals for the past 24 hrs:  BP Temp Temp src Pulse Resp SpO2 Height Weight  11/03/19 1601 115/62 -- -- -- -- -- -- --  11/03/19 1143 125/74 99.2 F (37.3 C) Oral 84 16 100 % 4\' 11"  (1.499 m) 50.8 kg  11/03/19 1018 130/75 98.3 F (36.8 C) Oral 90 14 100 % -- --   Constitutional: Well-developed, well-nourished female in no acute distress.  Cardiovascular: normal rate & rhythm, no murmur Respiratory: normal rate and effort. Lung sounds clear throughout GI: Abd soft, non-tender, Pos BS x 4. No guarding or rebound tenderness MS: Extremities nontender, no edema, normal ROM Neurologic: Alert and oriented x 4.    LAB RESULTS Results for orders  placed or performed during the hospital encounter of 11/03/19 (from the past 24 hour(s))  Pregnancy, urine POC     Status: Abnormal   Collection Time: 11/03/19 11:57 AM  Result Value Ref Range   Preg Test, Ur POSITIVE (A) NEGATIVE  Urinalysis, Routine w reflex microscopic     Status: Abnormal   Collection Time: 11/03/19 12:00 PM  Result Value Ref Range   Color, Urine AMBER (A) YELLOW   APPearance HAZY (A) CLEAR   Specific Gravity, Urine 1.029 1.005 - 1.030   pH 6.0 5.0 - 8.0   Glucose, UA NEGATIVE NEGATIVE mg/dL   Hgb urine dipstick SMALL (A) NEGATIVE   Bilirubin Urine NEGATIVE NEGATIVE   Ketones, ur 80 (A) NEGATIVE mg/dL   Protein, ur 30 (A) NEGATIVE mg/dL   Nitrite NEGATIVE NEGATIVE   Leukocytes,Ua TRACE (A) NEGATIVE   RBC / HPF 0-5 0 - 5 RBC/hpf   WBC, UA 0-5 0 - 5 WBC/hpf   Bacteria, UA RARE (A) NONE SEEN   Squamous Epithelial / LPF 0-5 0 - 5   Mucus PRESENT     IMAGING No results found.  MAU COURSE Orders Placed This Encounter  Procedures  . Urinalysis, Routine w reflex microscopic  . Pregnancy, urine POC  . Discharge patient   Meds ordered this encounter  Medications  . lactated ringers bolus 1,000 mL  . promethazine (PHENERGAN) injection 25 mg  . famotidine (PEPCID) IVPB 20 mg premix  . metoCLOPramide (REGLAN) 10 MG tablet    Sig: Take 1 tablet (10 mg total) by mouth every 8 (eight) hours as needed for nausea.    Dispense:  30 tablet    Refill:  0    Order Specific Question:   Supervising Provider    Answer:   01/01/20 Conan Bowens    MDM No abdominal pain or vaginal bleeding U/a with 80 of ketones & high specific gravity. IV fluid bolus, phenergan, & pepcid given. Pt not vomiting in MAU & reports improvement in symptoms  ASSESSMENT 1. Nausea and vomiting during pregnancy prior to [redacted] weeks gestation     PLAN Discharge home in stable condition. Discussed reasons to return to MAU Rx reglan Discussed dosing regimen for diclegis  Follow-up  Information    [facility] Follow up.   Specialty: Obstetrics and Gynecology Why: keep scheduled appointment or call office as needed Contact information: [address] [place] [phone]         Allergies as of 11/03/2019   No Known Allergies  Medication List    STOP taking these medications   cetirizine 10 MG tablet Commonly known as: ZYRTEC   clobetasol cream 0.05 % Commonly known as: TEMOVATE   sulfamethoxazole-trimethoprim 800-160 MG tablet Commonly known as: BACTRIM DS     TAKE these medications   metoCLOPramide 10 MG tablet Commonly known as: REGLAN Take 1 tablet (10 mg total) by mouth every 8 (eight) hours as needed for nausea.        Jorje Guild, NP 11/03/2019  7:16 PM

## 2019-11-30 ENCOUNTER — Other Ambulatory Visit (HOSPITAL_COMMUNITY): Payer: Self-pay | Admitting: Obstetrics and Gynecology

## 2019-11-30 DIAGNOSIS — Z3A19 19 weeks gestation of pregnancy: Secondary | ICD-10-CM

## 2019-11-30 DIAGNOSIS — Z3689 Encounter for other specified antenatal screening: Secondary | ICD-10-CM

## 2019-12-28 ENCOUNTER — Telehealth: Payer: Self-pay | Admitting: Family Medicine

## 2019-12-28 NOTE — Telephone Encounter (Signed)
I am happy to help but looks like we need to do a visit to discuss this. It can be a virtual or in office.

## 2019-12-28 NOTE — Telephone Encounter (Signed)
PT CALLED and is requesting a refill on her kenalog cream for her ezaema please send to the [facility] [address] [place] - [address]

## 2019-12-28 NOTE — Telephone Encounter (Signed)
Pt is doing virtual next Monday

## 2020-01-03 ENCOUNTER — Encounter: Payer: Self-pay | Admitting: Family Medicine

## 2020-01-03 ENCOUNTER — Other Ambulatory Visit: Payer: Self-pay

## 2020-01-03 ENCOUNTER — Ambulatory Visit (INDEPENDENT_AMBULATORY_CARE_PROVIDER_SITE_OTHER): Payer: BC Managed Care – PPO | Admitting: Family Medicine

## 2020-01-03 VITALS — Wt 111.0 lb

## 2020-01-03 DIAGNOSIS — Z3490 Encounter for supervision of normal pregnancy, unspecified, unspecified trimester: Secondary | ICD-10-CM

## 2020-01-03 DIAGNOSIS — Z872 Personal history of diseases of the skin and subcutaneous tissue: Secondary | ICD-10-CM | POA: Diagnosis not present

## 2020-01-03 DIAGNOSIS — R21 Rash and other nonspecific skin eruption: Secondary | ICD-10-CM | POA: Diagnosis not present

## 2020-01-03 NOTE — Progress Notes (Signed)
   Subjective:  Documentation for virtual audio and video telecommunications through [employer] encounter:  The patient was located at home. 2 patient identifiers used.  The provider was located in the office. The patient did consent to this visit and is aware of possible charges through their insurance for this visit.  The other persons participating in this telemedicine service were none.    Patient ID: Katie Pratt, female    DOB: 1996-09-03, 24 y.o.   MRN: [id number]  HPI Chief Complaint  Patient presents with  . ezcema    ezcema- neck and arm- needs cream that helps clear up. triamcolone acetonide ointment.    Complains of a painful rash on the back of her neck for several weeks now as well as 2 areas of less severe rash on bilateral forearms. States she has been using triamcinolone on the areas without any improvement. States it seems to be getting worse. She has not discussed this with her OB/GYN.  States she is pregnant and due 05/31/2020. She is having a boy.   OB/GYN- [facility]   States he is working outside and the heat makes her rash worse.  Using Cocoa Butter.   No fever, chills, N/V/D. No arthralgias or myalgias.    Review of Systems Pertinent positives and negatives in the history of present illness.     Objective:   Physical Exam Wt 111 lb (50.3 kg)   LMP 08/25/2019   BMI 22.42 kg/m   Posterior neck with a large oval area of dry, scaly and plaque appearing lesion. Dry patches on bilateral arms.       Assessment & Plan:  Rash of neck - Plan: Ambulatory referral to Dermatology  Hx of eczema - Plan: Ambulatory referral to Dermatology  Pregnancy, unspecified gestational age - Plan: Ambulatory referral to Dermatology  Severe eczema flare vs psoriasis. She will take cool showers and use a thick moisturizer. Refer to dermatologist.  Discussed that using large amounts of topical steroids for long periods of time is not recommended in pregnancy. No  known data supporting safety.    Time spent on call was 11 minutes and in review of previous records 15 minutes total.  This virtual service is not related to other E/M service within previous 7 days.

## 2020-01-10 ENCOUNTER — Ambulatory Visit (HOSPITAL_COMMUNITY)
Admission: RE | Admit: 2020-01-10 | Discharge: 2020-01-10 | Disposition: A | Discharge status: Home / Self Care | Payer: BC Managed Care – PPO | Source: Ambulatory Visit | Attending: Obstetrics and Gynecology | Admitting: Obstetrics and Gynecology

## 2020-01-10 ENCOUNTER — Other Ambulatory Visit (HOSPITAL_COMMUNITY): Payer: Self-pay | Admitting: *Deleted

## 2020-01-10 ENCOUNTER — Other Ambulatory Visit: Payer: Self-pay

## 2020-01-10 ENCOUNTER — Other Ambulatory Visit (HOSPITAL_COMMUNITY): Payer: Self-pay | Admitting: Obstetrics and Gynecology

## 2020-01-10 DIAGNOSIS — Z3A19 19 weeks gestation of pregnancy: Secondary | ICD-10-CM

## 2020-01-10 DIAGNOSIS — O283 Abnormal ultrasonic finding on antenatal screening of mother: Secondary | ICD-10-CM | POA: Diagnosis not present

## 2020-01-10 DIAGNOSIS — Z3689 Encounter for other specified antenatal screening: Secondary | ICD-10-CM

## 2020-01-10 DIAGNOSIS — Z363 Encounter for antenatal screening for malformations: Secondary | ICD-10-CM | POA: Diagnosis not present

## 2020-01-11 ENCOUNTER — Encounter (HOSPITAL_COMMUNITY): Payer: Self-pay | Admitting: Obstetrics & Gynecology

## 2020-01-13 ENCOUNTER — Other Ambulatory Visit: Payer: Self-pay

## 2020-01-13 ENCOUNTER — Emergency Department (HOSPITAL_BASED_OUTPATIENT_CLINIC_OR_DEPARTMENT_OTHER)
Admission: EM | Admit: 2020-01-13 | Discharge: 2020-01-13 | Disposition: A | Discharge status: Home / Self Care | Payer: BC Managed Care – PPO | Attending: Emergency Medicine | Admitting: Emergency Medicine

## 2020-01-13 ENCOUNTER — Encounter (HOSPITAL_BASED_OUTPATIENT_CLINIC_OR_DEPARTMENT_OTHER): Payer: Self-pay | Admitting: *Deleted

## 2020-01-13 DIAGNOSIS — O26892 Other specified pregnancy related conditions, second trimester: Secondary | ICD-10-CM | POA: Diagnosis not present

## 2020-01-13 DIAGNOSIS — M25551 Pain in right hip: Secondary | ICD-10-CM | POA: Insufficient documentation

## 2020-01-13 DIAGNOSIS — M7918 Myalgia, other site: Secondary | ICD-10-CM | POA: Diagnosis not present

## 2020-01-13 DIAGNOSIS — Z3A00 Weeks of gestation of pregnancy not specified: Secondary | ICD-10-CM | POA: Insufficient documentation

## 2020-01-13 LAB — URINALYSIS, ROUTINE W REFLEX MICROSCOPIC
Bilirubin Urine: NEGATIVE
Glucose, UA: NEGATIVE mg/dL
Hgb urine dipstick: NEGATIVE
Ketones, ur: NEGATIVE mg/dL
Leukocytes,Ua: NEGATIVE
Nitrite: NEGATIVE
Protein, ur: NEGATIVE mg/dL
Specific Gravity, Urine: 1.015 (ref 1.005–1.030)
pH: 8 (ref 5.0–8.0)

## 2020-01-13 MED ORDER — CYCLOBENZAPRINE HCL 10 MG PO TABS: 10.0000 mg | ORAL_TABLET | Freq: Two times a day (BID) | ORAL | 0 refills | Status: DC | PRN

## 2020-01-13 MED ORDER — CYCLOBENZAPRINE HCL 5 MG PO TABS
5.0000 mg | ORAL_TABLET | Freq: Once | ORAL | Status: AC
  Administered 2020-01-13: 5 mg via ORAL

## 2020-01-13 MED ORDER — CYCLOBENZAPRINE HCL 10 MG PO TABS
ORAL_TABLET | ORAL | Status: AC
  Filled 2020-01-13: qty 1

## 2020-01-13 MED ORDER — ACETAMINOPHEN 325 MG PO TABS: 650.0000 mg | ORAL_TABLET | Freq: Four times a day (QID) | ORAL | 0 refills | Status: DC | PRN

## 2020-01-13 MED ORDER — ACETAMINOPHEN 500 MG PO TABS
1000.0000 mg | ORAL_TABLET | Freq: Once | ORAL | Status: AC
  Administered 2020-01-13: 1000 mg via ORAL
  Filled 2020-01-13: qty 2

## 2020-01-13 NOTE — ED Triage Notes (Addendum)
Pt c/o right hip pain which radiates down right leg x 1 day. Pt reports   5 months preg

## 2020-01-13 NOTE — ED Provider Notes (Signed)
Emergency Department Provider Note   I have reviewed the triage vital signs and the nursing notes.   HISTORY  Chief Complaint Hip Pain   HPI Katie Pratt is a 24 y.o. female who is approximately 5 months pregnant the presents the emerge department today with right-sided gluteal pain.  Patient states about a week ago she had an episode where she had some gluteal pain radiated around her leg with the improved on its own.  Tonight she was walking and the same thing happened again.  She states that she had severe right gluteal pain and then some in her right anterior leg with the leg stuff is improved.  No urinary symptoms.  No back pain.  The pain is described as dull and achy.  Worse with certain movements.  Does not have any sharp pain, paresthesias, numbness, weakness or other associated symptoms. no recent trauma.   No other associated or modifying symptoms.    Past Medical History:  Diagnosis Date  . Anemia   . Eczema     Patient Active Problem List   Diagnosis Date Noted  . Encounter to establish care 12/03/2016  . DYSMENORRHEA 04/05/2010  . IRREGULAR MENSES 01/16/2009  . Eczema 01/16/2009    Past Surgical History:  Procedure Laterality Date  . NO PAST SURGERIES      Current Outpatient Rx  . Order #: [id number] Class: Print  . Order #: [id number] Class: Print    Allergies Patient has no known allergies.  Family History  Problem Relation Age of Onset  . AAA (abdominal aortic aneurysm) Mother   . Diabetes Maternal Grandmother   . Heart failure Maternal Grandmother   . Obesity Maternal Grandmother   . Arthritis Maternal Grandmother     Social History Social History   Tobacco Use  . Smoking status: Never Smoker  . Smokeless tobacco: Never Used  Substance Use Topics  . Alcohol use: No  . Drug use: No    Review of Systems  All other systems negative except as documented in the HPI. All pertinent positives and negatives as reviewed in the  HPI. ____________________________________________   PHYSICAL EXAM:  VITAL SIGNS: ED Triage Vitals  Enc Vitals Group     BP 01/13/20 2258 113/72     Pulse Rate 01/13/20 2258 83     Resp 01/13/20 2258 18     Temp 01/13/20 2258 98.7 F (37.1 C)     Temp Source 01/13/20 2258 Oral     SpO2 01/13/20 2258 100 %     Weight 01/13/20 2255 112 lb (50.8 kg)     Height 01/13/20 2255 4\' 11"  (1.499 m)    Constitutional: Alert and oriented. Well appearing and in no acute distress. Eyes: Conjunctivae are normal. PERRL. EOMI. Head: Atraumatic. Nose: No congestion/rhinnorhea. Mouth/Throat: Mucous membranes are moist.  Oropharynx non-erythematous. Neck: No stridor.  No meningeal signs.   Cardiovascular: Normal rate, regular rhythm. Good peripheral circulation. Grossly normal heart sounds.   Respiratory: Normal respiratory effort.  No retractions. Lungs CTAB. Gastrointestinal: Soft and nontender. No distention.  Musculoskeletal: pain with active ROM of right leg in gluteal area. Pain with palpation of same area. No neuro changes.  Neurologic:  Normal speech and language. No gross focal neurologic deficits are appreciated.  Skin:  Skin is warm, dry and intact. No rash noted.  ____________________________________________   LABS (all labs ordered are listed, but only abnormal results are displayed)  Labs Reviewed  URINALYSIS, ROUTINE W REFLEX MICROSCOPIC - Abnormal; Notable for the  following components:      Result Value   APPearance HAZY (*)    All other components within normal limits   ____________________________________________   INITIAL IMPRESSION / ASSESSMENT AND PLAN / ED COURSE  Pain seems muscular in nature.  Low suspicion for sciatica, kidney stone, pyelonephritis or other emergent causes at this time.  Will treat with Flexeril and Tylenol. No indication for imaging. Primary care follow-up.     Pertinent labs & imaging results that were available during my care of the patient  were reviewed by me and considered in my medical decision making (see chart for details).   A medical screening exam was performed and I feel the patient has had an appropriate workup for their chief complaint at this time and likelihood of emergent condition existing is low. They have been counseled on decision, discharge, follow up and which symptoms necessitate immediate return to the emergency department. They or their family verbally stated understanding and agreement with plan and discharged in stable condition.   ____________________________________________  FINAL CLINICAL IMPRESSION(S) / ED DIAGNOSES  Final diagnoses:  Musculoskeletal pain     MEDICATIONS GIVEN DURING THIS VISIT:  Medications  cyclobenzaprine (FLEXERIL) 10 MG tablet (has no administration in time range)  cyclobenzaprine (FLEXERIL) tablet 5 mg (5 mg Oral Given 01/13/20 2332)  acetaminophen (TYLENOL) tablet 1,000 mg (1,000 mg Oral Given 01/13/20 2331)     NEW OUTPATIENT MEDICATIONS STARTED DURING THIS VISIT:  Discharge Medication List as of 01/13/2020 11:20 PM    START taking these medications   Details  acetaminophen (TYLENOL) 325 MG tablet Take 2 tablets (650 mg total) by mouth every 6 (six) hours as needed., Starting Thu 01/13/2020, Print    cyclobenzaprine (FLEXERIL) 10 MG tablet Take 1 tablet (10 mg total) by mouth 2 (two) times daily as needed for muscle spasms., Starting Thu 01/13/2020, Print        Note:  This note was prepared with assistance of Dragon voice recognition software. Occasional wrong-word or sound-a-like substitutions may have occurred due to the inherent limitations of voice recognition software.   Marily Memos, MD 01/13/20 [phone]

## 2020-01-17 ENCOUNTER — Ambulatory Visit (INDEPENDENT_AMBULATORY_CARE_PROVIDER_SITE_OTHER): Payer: BC Managed Care – PPO | Admitting: Endocrinology

## 2020-01-17 ENCOUNTER — Other Ambulatory Visit: Payer: Self-pay

## 2020-01-17 ENCOUNTER — Encounter: Payer: Self-pay | Admitting: Endocrinology

## 2020-01-17 DIAGNOSIS — E0590 Thyrotoxicosis, unspecified without thyrotoxic crisis or storm: Secondary | ICD-10-CM | POA: Diagnosis not present

## 2020-01-17 NOTE — Patient Instructions (Signed)
Blood tests are requested for you today.  We'll let you know about the results.  If it is high again, I'll prescribe a medication for this. If ever you have fever while taking methimazole, stop it and call us, even if the reason is obvious, because of the risk of a rare side-effect. Please come back for a follow-up appointment in 1 month.

## 2020-01-17 NOTE — Progress Notes (Signed)
Subjective:    Patient ID: Katie Pratt, female    DOB: Dec 18, 1995, 24 y.o.   MRN: [id number]  HPI Pt is referred by Hetty Blend, NP, for hyperthyroidism.  Pt reports she was dx'ed with hyperthyroidism in early 2021.  she has never been on therapy for this.  she has never had XRT to the anterior neck, or thyroid surgery.  she has never had thyroid imaging.  she does not consume kelp or any other non-prescribed thyroid medication.  she has never been on amiodarone.  She had TFT checked when she reported palpitations.  She is at [redacted] weeks gestation now.  During the pregnancy, she lost 15 lbs, but she has since regained 9 lbs of that.  Past Medical History:  Diagnosis Date  . Anemia   . Eczema     Past Surgical History:  Procedure Laterality Date  . NO PAST SURGERIES      Social History   Socioeconomic History  . Marital status: Single    Spouse name: Not on file  . Number of children: Not on file  . Years of education: Not on file  . Highest education level: Not on file  Occupational History  . Not on file  Tobacco Use  . Smoking status: Never Smoker  . Smokeless tobacco: Never Used  Substance and Sexual Activity  . Alcohol use: No  . Drug use: No  . Sexual activity: Yes  Other Topics Concern  . Not on file  Social History Narrative  . Not on file   Social Determinants of Health   Financial Resource Strain:   . Difficulty of Paying Living Expenses:   Food Insecurity:   . Worried About Programme researcher, broadcasting/film/video in the Last Year:   . Barista in the Last Year:   Transportation Needs:   . Freight forwarder (Medical):   Marland Kitchen Lack of Transportation (Non-Medical):   Physical Activity:   . Days of Exercise per Week:   . Minutes of Exercise per Session:   Stress:   . Feeling of Stress :   Social Connections:   . Frequency of Communication with Friends and Family:   . Frequency of Social Gatherings with Friends and Family:   . Attends Religious Services:   .  Active Member of Clubs or Organizations:   . Attends Banker Meetings:   Marland Kitchen Marital Status:   Intimate Partner Violence:   . Fear of Current or Ex-Partner:   . Emotionally Abused:   Marland Kitchen Physically Abused:   . Sexually Abused:     Current Outpatient Medications on File Prior to Visit  Medication Sig Dispense Refill  . acetaminophen (TYLENOL) 325 MG tablet Take 2 tablets (650 mg total) by mouth every 6 (six) hours as needed. 30 tablet 0  . Prenatal Vit-Fe Fumarate-FA (MULTIVITAMIN-PRENATAL) 27-0.8 MG TABS tablet Take 1 tablet by mouth daily at 12 noon.     No current facility-administered medications on file prior to visit.    No Known Allergies  Family History  Problem Relation Age of Onset  . AAA (abdominal aortic aneurysm) Mother   . Diabetes Maternal Grandmother   . Heart failure Maternal Grandmother   . Obesity Maternal Grandmother   . Arthritis Maternal Grandmother   . Thyroid disease Neg Hx     BP 110/60 (BP Location: Left Arm, Patient Position: Sitting, Cuff Size: Normal)   Pulse 100   Wt 114 lb 3.2 oz (51.8 kg)   LMP  08/25/2019   SpO2 99%   BMI 23.07 kg/m    Review of Systems denies fever, sob, muscle weakness, excessive diaphoresis, tremor, anxiety, and heat intolerance.       Objective:   Physical Exam VS: see vs page GEN: no distress HEAD: head: no deformity.   eyes: no periorbital swelling; mild bilat proptosis.   external nose and ears are normal NECK: thyroid is slightly and diffusely enlarged.   CHEST WALL: no deformity LUNGS: clear to auscultation CV: reg rate and rhythm, no murmur.  MUSCULOSKELETAL: muscle bulk and strength are grossly normal.  no obvious joint swelling.  gait is normal and steady EXTEMITIES: no deformity.  no edema PULSES: no carotid bruit NEURO:  cn 2-12 grossly intact.   readily moves all 4's.  sensation is intact to touch on all 4's.  No tremor SKIN:  Normal texture and temperature.  No rash or suspicious lesion  is visible.  Not diaphoretic.  Hyperpigmented rash at the back of the neck (she will see derm) NODES:  None palpable at the neck PSYCH: alert, well-oriented.  Does not appear anxious nor depressed.   Lab Results  Component Value Date   TSH 0.668 07/02/2019   outside test results are reviewed: TSH < 0.005 Free T4=1.83  I have reviewed outside records, and summarized: Pt was noted to have abnormal TFT, and referred here.  She was seen in ER, with right hip pain, but not muscle weakness.  Lab Results  Component Value Date   TSH 0.22 (L) 01/17/2020      Assessment & Plan:  Hyperthyroidism  Spontaneous improvement suggests subacute thyroiditis Please come back for a follow-up appointment in 1 week.  Pregnancy: we need to follow closely

## 2020-01-18 LAB — TSH: TSH: 0.22 u[IU]/mL — ABNORMAL LOW (ref 0.35–4.50)

## 2020-01-18 LAB — T4, FREE: Free T4: 0.59 ng/dL — ABNORMAL LOW (ref 0.60–1.60)

## 2020-01-19 ENCOUNTER — Other Ambulatory Visit: Payer: Self-pay

## 2020-01-24 ENCOUNTER — Other Ambulatory Visit: Payer: Self-pay

## 2020-01-24 ENCOUNTER — Encounter: Payer: Self-pay | Admitting: Endocrinology

## 2020-01-24 ENCOUNTER — Ambulatory Visit (INDEPENDENT_AMBULATORY_CARE_PROVIDER_SITE_OTHER): Payer: BC Managed Care – PPO | Admitting: Endocrinology

## 2020-01-24 VITALS — BP 124/70 | HR 118 | Ht 59.0 in | Wt 116.0 lb

## 2020-01-24 DIAGNOSIS — E0590 Thyrotoxicosis, unspecified without thyrotoxic crisis or storm: Secondary | ICD-10-CM | POA: Diagnosis not present

## 2020-01-24 LAB — TSH: TSH: 0.14 u[IU]/mL — ABNORMAL LOW (ref 0.35–4.50)

## 2020-01-24 LAB — T4, FREE: Free T4: 0.57 ng/dL — ABNORMAL LOW (ref 0.60–1.60)

## 2020-01-24 NOTE — Progress Notes (Signed)
Subjective:    Patient ID: Katie Pratt, female    DOB: [birth year], 24 y.o.   MRN: [id number]  HPI Pt returns for f/u of hyperthyroidism (dx'ed early 2021; she has never been on therapy for this; she has never had thyroid imaging; in 4/21, it significantly improved, without rx). She is at [redacted] weeks gestation now.  pt states she feels well in general.  Specifically, she denies palpitations and tremor.   Past Medical History:  Diagnosis Date  . Anemia   . Eczema     Past Surgical History:  Procedure Laterality Date  . NO PAST SURGERIES      Social History   Socioeconomic History  . Marital status: Single    Spouse name: Not on file  . Number of children: Not on file  . Years of education: Not on file  . Highest education level: Not on file  Occupational History  . Not on file  Tobacco Use  . Smoking status: Never Smoker  . Smokeless tobacco: Never Used  Substance and Sexual Activity  . Alcohol use: No  . Drug use: No  . Sexual activity: Yes  Other Topics Concern  . Not on file  Social History Narrative  . Not on file   Social Determinants of Health   Financial Resource Strain:   . Difficulty of Paying Living Expenses:   Food Insecurity:   . Worried About Charity fundraiser in the Last Year:   . Arboriculturist in the Last Year:   Transportation Needs:   . Film/video editor (Medical):   Marland Kitchen Lack of Transportation (Non-Medical):   Physical Activity:   . Days of Exercise per Week:   . Minutes of Exercise per Session:   Stress:   . Feeling of Stress :   Social Connections:   . Frequency of Communication with Friends and Family:   . Frequency of Social Gatherings with Friends and Family:   . Attends Religious Services:   . Active Member of Clubs or Organizations:   . Attends Archivist Meetings:   Marland Kitchen Marital Status:   Intimate Partner Violence:   . Fear of Current or Ex-Partner:   . Emotionally Abused:   Marland Kitchen Physically Abused:   . Sexually  Abused:     Current Outpatient Medications on File Prior to Visit  Medication Sig Dispense Refill  . acetaminophen (TYLENOL) 325 MG tablet Take 2 tablets (650 mg total) by mouth every 6 (six) hours as needed. 30 tablet 0  . Prenatal Vit-Fe Fumarate-FA (MULTIVITAMIN-PRENATAL) 27-0.8 MG TABS tablet Take 1 tablet by mouth daily at 12 noon.     No current facility-administered medications on file prior to visit.    No Known Allergies  Family History  Problem Relation Age of Onset  . AAA (abdominal aortic aneurysm) Mother   . Diabetes Maternal Grandmother   . Heart failure Maternal Grandmother   . Obesity Maternal Grandmother   . Arthritis Maternal Grandmother   . Thyroid disease Neg Hx     BP 124/70   Pulse (!) 118   Ht 4\' 11"  (1.499 m)   Wt 116 lb (52.6 kg)   LMP 08/25/2019   SpO2 100%   BMI 23.43 kg/m    Review of Systems Denies fever    Objective:   Physical Exam VITAL SIGNS:  See vs page GENERAL: no distress NECK: thyroid is slightly and diffusely enlarged.     Lab Results  Component Value Date  TSH 0.14 (L) 01/24/2020      Assessment & Plan:  Hyperthyroidism: unchanged.  She may still spontaneously evolve hypothyroidism.   Pregnancy: in this setting, we need to follow TFT closely.  Patient Instructions  Blood tests are requested for you today.  We'll let you know about the results.  Please come back for a follow-up appointment in 2 weeks.

## 2020-01-24 NOTE — Patient Instructions (Signed)
Blood tests are requested for you today.  We'll let you know about the results.   Please come back for a follow-up appointment in 2 weeks 

## 2020-02-01 ENCOUNTER — Encounter: Payer: Self-pay | Admitting: Internal Medicine

## 2020-02-07 ENCOUNTER — Encounter: Payer: Self-pay | Admitting: Endocrinology

## 2020-02-07 ENCOUNTER — Other Ambulatory Visit: Payer: Self-pay

## 2020-02-07 ENCOUNTER — Ambulatory Visit (INDEPENDENT_AMBULATORY_CARE_PROVIDER_SITE_OTHER): Payer: BC Managed Care – PPO | Admitting: Endocrinology

## 2020-02-07 VITALS — BP 114/70 | HR 111 | Ht 59.0 in | Wt 119.0 lb

## 2020-02-07 DIAGNOSIS — E0590 Thyrotoxicosis, unspecified without thyrotoxic crisis or storm: Secondary | ICD-10-CM | POA: Diagnosis not present

## 2020-02-07 NOTE — Patient Instructions (Addendum)
Blood tests are requested for you today.  We'll let you know about the results.   Please come back for a follow-up appointment in 6 weeks.  

## 2020-02-07 NOTE — Progress Notes (Signed)
Subjective:    Patient ID: Katie Pratt, female    DOB: [birth year], 24 y.o.   MRN: [id number]  HPI Pt returns for f/u of hyperthyroidism (dx'ed early 2021; she has never been on therapy for this; she has never had thyroid imaging; in 4/21, it significantly improved, without rx). She is at [redacted] weeks gestation now.  pt states she feels well in general.   Past Medical History:  Diagnosis Date  . Anemia   . Eczema     Past Surgical History:  Procedure Laterality Date  . NO PAST SURGERIES      Social History   Socioeconomic History  . Marital status: Single    Spouse name: Not on file  . Number of children: Not on file  . Years of education: Not on file  . Highest education level: Not on file  Occupational History  . Not on file  Tobacco Use  . Smoking status: Never Smoker  . Smokeless tobacco: Never Used  Substance and Sexual Activity  . Alcohol use: No  . Drug use: No  . Sexual activity: Yes  Other Topics Concern  . Not on file  Social History Narrative  . Not on file   Social Determinants of Health   Financial Resource Strain:   . Difficulty of Paying Living Expenses:   Food Insecurity:   . Worried About Charity fundraiser in the Last Year:   . Arboriculturist in the Last Year:   Transportation Needs:   . Film/video editor (Medical):   Marland Kitchen Lack of Transportation (Non-Medical):   Physical Activity:   . Days of Exercise per Week:   . Minutes of Exercise per Session:   Stress:   . Feeling of Stress :   Social Connections:   . Frequency of Communication with Friends and Family:   . Frequency of Social Gatherings with Friends and Family:   . Attends Religious Services:   . Active Member of Clubs or Organizations:   . Attends Archivist Meetings:   Marland Kitchen Marital Status:   Intimate Partner Violence:   . Fear of Current or Ex-Partner:   . Emotionally Abused:   Marland Kitchen Physically Abused:   . Sexually Abused:     Current Outpatient Medications on File  Prior to Visit  Medication Sig Dispense Refill  . acetaminophen (TYLENOL) 325 MG tablet Take 2 tablets (650 mg total) by mouth every 6 (six) hours as needed. 30 tablet 0  . Prenatal Vit-Fe Fumarate-FA (MULTIVITAMIN-PRENATAL) 27-0.8 MG TABS tablet Take 1 tablet by mouth daily at 12 noon.     No current facility-administered medications on file prior to visit.    No Known Allergies  Family History  Problem Relation Age of Onset  . AAA (abdominal aortic aneurysm) Mother   . Diabetes Maternal Grandmother   . Heart failure Maternal Grandmother   . Obesity Maternal Grandmother   . Arthritis Maternal Grandmother   . Thyroid disease Neg Hx     BP 114/70   Pulse (!) 111   Ht 4\' 11"  (1.499 m)   Wt 119 lb (54 kg)   LMP 08/25/2019   SpO2 99%   BMI 24.04 kg/m    Review of Systems she denies palpitations and tremor    Objective:   Physical Exam VITAL SIGNS:  See vs page GENERAL: no distress.  NECK: There is no palpable thyroid enlargement.  No thyroid nodule is palpable.  No palpable lymphadenopathy at the anterior neck.  Lab Results  Component Value Date   TSH 0.22 (L) 02/07/2020       Assessment & Plan:  Hyperthyroidism: mild. Stable.  Pregnancy: in view of this, no thyroid rx of this is advised.  Tachycardia: could be thyroid-related, but pregnancy could also contribute.  We'll follow.   Please come back for a follow-up appointment in 6 weeks

## 2020-02-08 LAB — TSH: TSH: 0.22 u[IU]/mL — ABNORMAL LOW (ref 0.35–4.50)

## 2020-02-08 LAB — T4, FREE: Free T4: 0.6 ng/dL (ref 0.60–1.60)

## 2020-02-14 ENCOUNTER — Ambulatory Visit: Payer: BC Managed Care – PPO | Admitting: Endocrinology

## 2020-02-16 ENCOUNTER — Inpatient Hospital Stay (HOSPITAL_COMMUNITY)
Admission: AD | Admit: 2020-02-16 | Discharge: 2020-02-16 | Disposition: A | Discharge status: Home / Self Care | Payer: BC Managed Care – PPO | Attending: Obstetrics and Gynecology | Admitting: Obstetrics and Gynecology

## 2020-02-16 ENCOUNTER — Other Ambulatory Visit: Payer: Self-pay

## 2020-02-16 ENCOUNTER — Encounter (HOSPITAL_COMMUNITY): Payer: Self-pay | Admitting: Obstetrics and Gynecology

## 2020-02-16 DIAGNOSIS — B373 Candidiasis of vulva and vagina: Secondary | ICD-10-CM | POA: Diagnosis not present

## 2020-02-16 DIAGNOSIS — R519 Headache, unspecified: Secondary | ICD-10-CM | POA: Diagnosis not present

## 2020-02-16 DIAGNOSIS — Z3A25 25 weeks gestation of pregnancy: Secondary | ICD-10-CM | POA: Diagnosis not present

## 2020-02-16 DIAGNOSIS — O98812 Other maternal infectious and parasitic diseases complicating pregnancy, second trimester: Secondary | ICD-10-CM | POA: Diagnosis present

## 2020-02-16 DIAGNOSIS — Z3689 Encounter for other specified antenatal screening: Secondary | ICD-10-CM

## 2020-02-16 LAB — WET PREP, GENITAL
Clue Cells Wet Prep HPF POC: NONE SEEN
Sperm: NONE SEEN
Trich, Wet Prep: NONE SEEN

## 2020-02-16 LAB — URINALYSIS, ROUTINE W REFLEX MICROSCOPIC
Bilirubin Urine: NEGATIVE
Glucose, UA: NEGATIVE mg/dL
Hgb urine dipstick: NEGATIVE
Ketones, ur: NEGATIVE mg/dL
Nitrite: NEGATIVE
Protein, ur: NEGATIVE mg/dL
Specific Gravity, Urine: 1.014 (ref 1.005–1.030)
pH: 7 (ref 5.0–8.0)

## 2020-02-16 MED ORDER — TERCONAZOLE 0.4 % VA CREA: 1.0000 | TOPICAL_CREAM | Freq: Every day | VAGINAL | 0 refills | Status: DC

## 2020-02-16 MED ORDER — BUTALBITAL-APAP-CAFFEINE 50-325-40 MG PO TABS
2.0000 | ORAL_TABLET | Freq: Once | ORAL | Status: AC
  Administered 2020-02-16: 2 via ORAL
  Filled 2020-02-16: qty 2

## 2020-02-16 NOTE — MAU Provider Note (Signed)
History     CSN: [id number]  Arrival date and time: 02/16/20 1229   First Provider Initiated Contact with Patient 02/16/20 1319      Chief Complaint  Patient presents with  . Headache  . Rupture of Membranes   Katie Pratt is a 24 y.o. G1P0 at [redacted]w[redacted]d who receives care at [facility].  She presents today for Rupture of Membranes.  She states she noted leaking around 11am this morning.  She states she was walking into the bathroom and "felt something going down my legs."  Patient states she did not have on underwear at the time and "it was two lines going down my legs," when asked how much fluid. She states she wore a pad to the MAU and noted that some fluid was on it upon arrival.  She denies vaginal bleeding or discharge prior to the leaking.  She endorses fetal movement and denies abdominal cramping or contractions.  Patient further denies sexual activity in the past 3 days.      OB History    Gravida  1   Para      Term      Preterm      AB      Living        SAB      TAB      Ectopic      Multiple      Live Births              Past Medical History:  Diagnosis Date  . Anemia   . Eczema     Past Surgical History:  Procedure Laterality Date  . NO PAST SURGERIES      Family History  Problem Relation Age of Onset  . AAA (abdominal aortic aneurysm) Mother   . Diabetes Maternal Grandmother   . Heart failure Maternal Grandmother   . Obesity Maternal Grandmother   . Arthritis Maternal Grandmother   . Thyroid disease Neg Hx     Social History   Tobacco Use  . Smoking status: Never Smoker  . Smokeless tobacco: Never Used  Substance Use Topics  . Alcohol use: No  . Drug use: No    Allergies: No Known Allergies  Medications Prior to Admission  Medication Sig Dispense Refill Last Dose  . acetaminophen (TYLENOL) 325 MG tablet Take 2 tablets (650 mg total) by mouth every 6 (six) hours as needed. 30 tablet 0 02/16/2020 at Unknown time  . Prenatal  Vit-Fe Fumarate-FA (MULTIVITAMIN-PRENATAL) 27-0.8 MG TABS tablet Take 1 tablet by mouth daily at 12 noon.   02/16/2020 at Unknown time    Review of Systems  Constitutional: Negative for chills and fever.  Eyes: Negative for visual disturbance.  Gastrointestinal: Positive for constipation ("Kinda yesterday"). Negative for abdominal pain, diarrhea, nausea and vomiting.  Genitourinary: Negative for difficulty urinating, dysuria, pelvic pain, vaginal bleeding and vaginal discharge.  Musculoskeletal: Negative for back pain.  Neurological: Positive for headaches (This morning-relieved with tylenol). Negative for dizziness and light-headedness.   Physical Exam   Blood pressure 127/64, pulse 93, temperature 98 F (36.7 C), resp. rate 16, weight 54 kg, last menstrual period 08/25/2019, SpO2 100 %.  Physical Exam  Constitutional: She is oriented to person, place, and time. She appears well-developed and well-nourished.  HENT:  Head: Normocephalic and atraumatic.  Eyes: Conjunctivae are normal.  Cardiovascular: Normal rate, regular rhythm and normal heart sounds.  Respiratory: Effort normal and breath sounds normal. No respiratory distress.  GI: Soft.  Genitourinary:  Cervix exhibits friability. Cervix exhibits no discharge.    Vaginal discharge present.     No vaginal bleeding.  No bleeding in the vagina.    Genitourinary Comments: Sterile Speculum Exam: -Normal External Genitalia: Non tender, no apparent discharge at introitus.  -Vaginal Vault: Pink mucosa with good rugae. Moderate amt watery white with greenish curd-like discharge -wet prep and Fern collected -Cervix:Pink, no lesions, cysts, or polyps.  Appears closed. No active bleeding from os-GC/CT collected and cervix showing friability.  -Bimanual Exam: Closed     Musculoskeletal:        General: Normal range of motion.     Cervical back: Normal range of motion.  Neurological: She is alert and oriented to person, place, and time.   Skin: Skin is warm and dry.  Psychiatric: She has a normal mood and affect. Her behavior is normal.    Fetal Assessment 145 bpm, Mod Var, -Decels, +Accels Toco: No ctx graphed  MAU Course   Results for orders placed or performed during the hospital encounter of 02/16/20 (from the past 24 hour(s))  Urinalysis, Routine w reflex microscopic     Status: Abnormal   Collection Time: 02/16/20 12:36 PM  Result Value Ref Range   Color, Urine YELLOW YELLOW   APPearance CLOUDY (A) CLEAR   Specific Gravity, Urine 1.014 1.005 - 1.030   pH 7.0 5.0 - 8.0   Glucose, UA NEGATIVE NEGATIVE mg/dL   Hgb urine dipstick NEGATIVE NEGATIVE   Bilirubin Urine NEGATIVE NEGATIVE   Ketones, ur NEGATIVE NEGATIVE mg/dL   Protein, ur NEGATIVE NEGATIVE mg/dL   Nitrite NEGATIVE NEGATIVE   Leukocytes,Ua LARGE (A) NEGATIVE   RBC / HPF 0-5 0 - 5 RBC/hpf   WBC, UA 6-10 0 - 5 WBC/hpf   Bacteria, UA RARE (A) NONE SEEN   Squamous Epithelial / LPF 11-20 0 - 5   Mucus PRESENT    Amorphous Crystal PRESENT   Wet prep, genital     Status: Abnormal   Collection Time: 02/16/20  1:28 PM  Result Value Ref Range   Yeast Wet Prep HPF POC PRESENT (A) NONE SEEN   Trich, Wet Prep NONE SEEN NONE SEEN   Clue Cells Wet Prep HPF POC NONE SEEN NONE SEEN   WBC, Wet Prep HPF POC MANY (A) NONE SEEN   Sperm NONE SEEN    No results found.  MDM PE Labs: UA, Wet Prep, GC/CT, Fern EFM Pain Medication Assessment and Plan  24 year old G1P0  SIUP at 25 weeks Cat I FT ? Leakage of Fluid Headache Candidiasis of Vagina  -POC reviewed -Exam performed and findings discussed. -Patient informed that findings suspicious for yeast infection. -Reviewed how to treat. -Discussed causes of yeast infection to include nutritional intake. -Reviewed improving diet as patient she reports eating lots of carbs and sweets. -Cultures collected and pending.  -Patient reports onset of HA after pelvic exam. -Discussed informing primary ob provider  of HA frequency for treatment and/or referral to neurology. -Will give Fioricet today.  -Informed that provider will return to bedside if other diagnosis made, otherwise will discharge with treatment.  -Encouraged to call or return to MAU if symptoms worsen or with the onset of new symptoms.  Cherre Robins MSN, CNM 02/16/2020, 1:19 PM   Reassessment (2:23 PM) No leakage of Fluid Fern Negative  -Wet prep returns positive for yeast only. -Treatment for Terazol sent to pharmacy on file.  -Patient to keep appt as scheduled for June 15th. -Discharged to  home in stable condition.  Maryann Conners MSN, CNM Advanced Practice Provider, [facility]

## 2020-02-16 NOTE — Discharge Instructions (Signed)
Vaginal Yeast Infection, Adult  Vaginal yeast infection is a condition that causes vaginal discharge as well as soreness, swelling, and redness (inflammation) of the vagina. This is a common condition. Some women get this infection frequently. What are the causes? This condition is caused by a change in the normal balance of the yeast (candida) and bacteria that live in the vagina. This change causes an overgrowth of yeast, which causes the inflammation. What increases the risk? The condition is more likely to develop in women who:  Take antibiotic medicines.  Have diabetes.  Take birth control pills.  Are pregnant.  Douche often.  Have a weak body defense system (immune system).  Have been taking steroid medicines for a long time.  Frequently wear tight clothing. What are the signs or symptoms? Symptoms of this condition include:  White, thick, creamy vaginal discharge.  Swelling, itching, redness, and irritation of the vagina. The lips of the vagina (vulva) may be affected as well.  Pain or a burning feeling while urinating.  Pain during sex. How is this diagnosed? This condition is diagnosed based on:  Your medical history.  A physical exam.  A pelvic exam. Your health care provider will examine a sample of your vaginal discharge under a microscope. Your health care provider may send this sample for testing to confirm the diagnosis. How is this treated? This condition is treated with medicine. Medicines may be over-the-counter or prescription. You may be told to use one or more of the following:  Medicine that is taken by mouth (orally).  Medicine that is applied as a cream (topically).  Medicine that is inserted directly into the vagina (suppository). Follow these instructions at home:  Lifestyle  Do not have sex until your health care provider approves. Tell your sex partner that you have a yeast infection. That person should go to his or her health care  provider and ask if they should also be treated.  Do not wear tight clothes, such as pantyhose or tight pants.  Wear breathable cotton underwear. General instructions  Take or apply over-the-counter and prescription medicines only as told by your health care provider.  Eat more yogurt. This may help to keep your yeast infection from returning.  Do not use tampons until your health care provider approves.  Try taking a sitz bath to help with discomfort. This is a warm water bath that is taken while you are sitting down. The water should only come up to your hips and should cover your buttocks. Do this 3-4 times per day or as told by your health care provider.  Do not douche.  If you have diabetes, keep your blood sugar levels under control.  Keep all follow-up visits as told by your health care provider. This is important. Contact a health care provider if:  You have a fever.  Your symptoms go away and then return.  Your symptoms do not get better with treatment.  Your symptoms get worse.  You have new symptoms.  You develop blisters in or around your vagina.  You have blood coming from your vagina and it is not your menstrual period.  You develop pain in your abdomen. Summary  Vaginal yeast infection is a condition that causes discharge as well as soreness, swelling, and redness (inflammation) of the vagina.  This condition is treated with medicine. Medicines may be over-the-counter or prescription.  Take or apply over-the-counter and prescription medicines only as told by your health care provider.  Do not douche.   Do not have sex or use tampons until your health care provider approves.  Contact a health care provider if your symptoms do not get better with treatment or your symptoms go away and then return. This information is not intended to replace advice given to you by your health care provider. Make sure you discuss any questions you have with your health care  provider. Document Revised: 04/09/2019 Document Reviewed: 01/26/2018 Elsevier Patient Education  2020 [employer]  

## 2020-02-16 NOTE — MAU Note (Signed)
.   Katie Pratt is a 24 y.o. at [redacted]w[redacted]d here in MAU reporting: leakage of fluid that started this morning, Reports headache at times that comes and goes. Denies any pain at this time  Onset of complaint: this morning Pain score: 0 Vitals:   02/16/20 1245  BP: 127/64  Pulse: 93  Resp: 16  Temp: 98 F (36.7 C)  SpO2: 100%     FHT:140 Lab orders placed from triage: UA

## 2020-02-17 LAB — GC/CHLAMYDIA PROBE AMP (~~LOC~~) NOT AT ARMC
Chlamydia: NEGATIVE
Comment: NEGATIVE
Comment: NORMAL
Neisseria Gonorrhea: NEGATIVE

## 2020-03-06 ENCOUNTER — Other Ambulatory Visit: Payer: Self-pay

## 2020-03-06 ENCOUNTER — Ambulatory Visit (HOSPITAL_COMMUNITY): Payer: BC Managed Care – PPO | Attending: Obstetrics and Gynecology

## 2020-03-06 ENCOUNTER — Ambulatory Visit: Payer: BC Managed Care – PPO | Admitting: *Deleted

## 2020-03-06 VITALS — BP 108/72 | HR 109

## 2020-03-06 DIAGNOSIS — O283 Abnormal ultrasonic finding on antenatal screening of mother: Secondary | ICD-10-CM | POA: Insufficient documentation

## 2020-03-06 DIAGNOSIS — Z3A27 27 weeks gestation of pregnancy: Secondary | ICD-10-CM | POA: Diagnosis not present

## 2020-03-06 DIAGNOSIS — O99282 Endocrine, nutritional and metabolic diseases complicating pregnancy, second trimester: Secondary | ICD-10-CM | POA: Insufficient documentation

## 2020-03-06 DIAGNOSIS — E039 Hypothyroidism, unspecified: Secondary | ICD-10-CM | POA: Insufficient documentation

## 2020-03-12 NOTE — Progress Notes (Deleted)
Cardiology Office Note:   Date:  03/12/2020  NAME:  Katie Pratt    MRN: [id number] DOB:  [birth year]   PCP:  Katie Shackleton, NP-C  Cardiologist:  No primary care provider on file.  Electrophysiologist:  None   Referring MD: Katie Pratt, CNM   No chief complaint on file. ***  History of Present Illness:   Katie Pratt is a 24 y.o. female with a hx of hyperthyroidism, pregnancy who is being seen today for the evaluation of palpitations at the request of Katie Pratt, Katie L, NP-C. Most recent TSH 0.22 and T4 0.60. She is not being treated for her hyperthyroidism per review of records by endocrinologist.   Past Medical History: Past Medical History:  Diagnosis Date  . Anemia   . Eczema   . Hypothyroid     Past Surgical History: Past Surgical History:  Procedure Laterality Date  . NO PAST SURGERIES      Current Medications: No outpatient medications have been marked as taking for the 03/14/20 encounter (Appointment) with O'Neal, Ronnald Ramp, MD.     Allergies:    Patient has no known allergies.   Social History: Social History   Socioeconomic History  . Marital status: Single    Spouse name: Not on file  . Number of children: Not on file  . Years of education: Not on file  . Highest education level: Not on file  Occupational History  . Not on file  Tobacco Use  . Smoking status: Never Smoker  . Smokeless tobacco: Never Used  Vaping Use  . Vaping Use: Never used  Substance and Sexual Activity  . Alcohol use: No  . Drug use: No  . Sexual activity: Yes  Other Topics Concern  . Not on file  Social History Narrative  . Not on file   Social Determinants of Health   Financial Resource Strain:   . Difficulty of Paying Living Expenses:   Food Insecurity:   . Worried About Programme researcher, broadcasting/film/video in the Last Year:   . Barista in the Last Year:   Transportation Needs:   . Freight forwarder (Medical):   Marland Kitchen Lack of Transportation (Non-Medical):     Physical Activity:   . Days of Exercise per Week:   . Minutes of Exercise per Session:   Stress:   . Feeling of Stress :   Social Connections:   . Frequency of Communication with Friends and Family:   . Frequency of Social Gatherings with Friends and Family:   . Attends Religious Services:   . Active Member of Clubs or Organizations:   . Attends Banker Meetings:   Marland Kitchen Marital Status:      Family History: The patient's ***family history includes AAA (abdominal aortic aneurysm) in her mother; Arthritis in her maternal grandmother; Diabetes in her maternal grandmother; Heart failure in her maternal grandmother; Obesity in her maternal grandmother. There is no history of Thyroid disease.  ROS:   All other ROS reviewed and negative. Pertinent positives noted in the HPI.     EKGs/Labs/Other Studies Reviewed:   The following studies were personally reviewed by me today:  EKG:  EKG is *** ordered today.  The ekg ordered today demonstrates ***, and was personally reviewed by me.   Recent Labs: 07/02/2019: ALT 10; BUN 8; Creatinine, Ser 0.67; Hemoglobin 13.7; Platelets 254; Potassium 4.4; Sodium 140 02/07/2020: TSH 0.22   Recent Lipid Panel    Component Value Date/Time  CHOL 121 07/02/2019 1142   TRIG 23 07/02/2019 1142   HDL 56 07/02/2019 1142   CHOLHDL 2.2 07/02/2019 1142   CHOLHDL 2.0 Ratio 04/05/2010 2130   VLDL 8 04/05/2010 2130   LDLCALC 57 07/02/2019 1142    Physical Exam:   VS:  LMP 08/25/2019    Wt Readings from Last 3 Encounters:  02/16/20 119 lb (54 kg)  02/07/20 119 lb (54 kg)  01/24/20 116 lb (52.6 kg)    General: Well nourished, well developed, in no acute distress Heart: Atraumatic, normal size  Eyes: PEERLA, EOMI  Neck: Supple, no JVD Endocrine: No thryomegaly Cardiac: Normal S1, S2; RRR; no murmurs, rubs, or gallops Lungs: Clear to auscultation bilaterally, no wheezing, rhonchi or rales  Abd: Soft, nontender, no hepatomegaly  Ext: No edema,  pulses 2+ Musculoskeletal: No deformities, BUE and BLE strength normal and equal Skin: Warm and dry, no rashes   Neuro: Alert and oriented to person, place, time, and situation, CNII-XII grossly intact, no focal deficits  Psych: Normal mood and affect   ASSESSMENT:   Katie Pratt is a 24 y.o. female who presents for the following: No diagnosis found.  PLAN:   There are no diagnoses linked to this encounter.  Disposition: No follow-ups on file.  Medication Adjustments/Labs and Tests Ordered: Current medicines are reviewed at length with the patient today.  Concerns regarding medicines are outlined above.  No orders of the defined types were placed in this encounter.  No orders of the defined types were placed in this encounter.   There are no Patient Instructions on file for this visit.   Time Spent with Patient: I have spent a total of *** minutes with patient reviewing hospital notes, telemetry, EKGs, labs and examining the patient as well as establishing an assessment and plan that was discussed with the patient.  > 50% of time was spent in direct patient care.  Signed, Addison Naegeli. Audie Box, [place]  [address], [place], [place] [address] [phone]  03/12/2020 2:29 PM

## 2020-03-14 ENCOUNTER — Ambulatory Visit: Payer: BC Managed Care – PPO | Admitting: Cardiovascular Disease

## 2020-03-18 ENCOUNTER — Inpatient Hospital Stay (HOSPITAL_COMMUNITY): Payer: BC Managed Care – PPO

## 2020-03-18 ENCOUNTER — Inpatient Hospital Stay (HOSPITAL_COMMUNITY)
Admission: AD | Admit: 2020-03-18 | Discharge: 2020-03-18 | Disposition: A | Discharge status: Home / Self Care | Payer: BC Managed Care – PPO | Attending: Obstetrics & Gynecology | Admitting: Obstetrics & Gynecology

## 2020-03-18 ENCOUNTER — Encounter (HOSPITAL_COMMUNITY): Payer: Self-pay | Admitting: Obstetrics & Gynecology

## 2020-03-18 ENCOUNTER — Other Ambulatory Visit: Payer: Self-pay

## 2020-03-18 DIAGNOSIS — Z3A29 29 weeks gestation of pregnancy: Secondary | ICD-10-CM | POA: Diagnosis not present

## 2020-03-18 DIAGNOSIS — O99891 Other specified diseases and conditions complicating pregnancy: Secondary | ICD-10-CM | POA: Diagnosis not present

## 2020-03-18 DIAGNOSIS — Z79899 Other long term (current) drug therapy: Secondary | ICD-10-CM | POA: Diagnosis not present

## 2020-03-18 DIAGNOSIS — E039 Hypothyroidism, unspecified: Secondary | ICD-10-CM | POA: Insufficient documentation

## 2020-03-18 DIAGNOSIS — O26893 Other specified pregnancy related conditions, third trimester: Secondary | ICD-10-CM | POA: Insufficient documentation

## 2020-03-18 DIAGNOSIS — R0602 Shortness of breath: Secondary | ICD-10-CM | POA: Insufficient documentation

## 2020-03-18 DIAGNOSIS — Z3689 Encounter for other specified antenatal screening: Secondary | ICD-10-CM

## 2020-03-18 DIAGNOSIS — O99283 Endocrine, nutritional and metabolic diseases complicating pregnancy, third trimester: Secondary | ICD-10-CM | POA: Insufficient documentation

## 2020-03-18 DIAGNOSIS — R109 Unspecified abdominal pain: Secondary | ICD-10-CM | POA: Insufficient documentation

## 2020-03-18 LAB — URINALYSIS, ROUTINE W REFLEX MICROSCOPIC
Bilirubin Urine: NEGATIVE
Glucose, UA: NEGATIVE mg/dL
Hgb urine dipstick: NEGATIVE
Ketones, ur: NEGATIVE mg/dL
Leukocytes,Ua: NEGATIVE
Nitrite: NEGATIVE
Protein, ur: NEGATIVE mg/dL
Specific Gravity, Urine: 1.004 — ABNORMAL LOW (ref 1.005–1.030)
pH: 7 (ref 5.0–8.0)

## 2020-03-18 NOTE — Discharge Instructions (Signed)
Abdominal Pain During Pregnancy  Belly (abdominal) pain is common during pregnancy. There are many possible causes. Most of the time, it is not a serious problem. Other times, it can be a sign that something is wrong with the pregnancy. Always tell your doctor if you have belly pain. Follow these instructions at home:  Do not have sex or put anything in your vagina until your pain goes away completely.  Get plenty of rest until your pain gets better.  Drink enough fluid to keep your pee (urine) pale yellow.  Take over-the-counter and prescription medicines only as told by your doctor.  Keep all follow-up visits as told by your doctor. This is important. Contact a doctor if:  Your pain continues or gets worse after resting.  You have lower belly pain that: ? Comes and goes at regular times. ? Spreads to your back. ? Feels like menstrual cramps.  You have pain or burning when you pee (urinate). Get help right away if:  You have a fever or chills.  You have vaginal bleeding.  You are leaking fluid from your vagina.  You are passing tissue from your vagina.  You throw up (vomit) for more than 24 hours.  You have watery poop (diarrhea) for more than 24 hours.  Your baby is moving less than usual.  You feel very weak or faint.  You have shortness of breath.  You have very bad pain in your upper belly. Summary  Belly (abdominal) pain is common during pregnancy. There are many possible causes.  If you have belly pain during pregnancy, tell your doctor right away.  Keep all follow-up visits as told by your doctor. This is important. This information is not intended to replace advice given to you by your health care provider. Make sure you discuss any questions you have with your health care provider. Document Revised: 12/28/2018 Document Reviewed: 12/12/2016 Elsevier Patient Education  2020 [employer] Inc[employer]  

## 2020-03-18 NOTE — MAU Note (Signed)
Pt reports to mau with c/o SOB that has been on going for the past few weeks.  Pt reports it has worsened since last night.  Pt also complains of abd pain that comes and goes, she is unsure if its ctx.  Pt denies lof or vag bleeding.  Reports good fetal movement.

## 2020-03-18 NOTE — MAU Provider Note (Signed)
History     CSN: [id number]  Arrival date and time: 03/18/20 1506   First Provider Initiated Contact with Patient 03/18/20 1555      Chief Complaint  Patient presents with  . Shortness of Breath  . Abdominal Pain   24 y.o. G1 @29 .2 wks presenting with SOB and abd pain. SOB started about 2 weeks ago but got worse last night while she was lying in bed, semi-reclined. Had SOB again this am at work while sitting. Denies CP. Denies other respiratory sx. Denies calf pain or swelling. Abdominal pain started today. Describes as "baby balling up then releasing". She reports frequency about q30 min. Rates pain 8/10. Denies urinary sx. Denies VB or LOF. +FM. Her pregnancy is complicated by mild Hyperthyroidism.   OB History    Gravida  1   Para      Term      Preterm      AB      Living        SAB      TAB      Ectopic      Multiple      Live Births              Past Medical History:  Diagnosis Date  . Anemia   . Eczema   . Hypothyroid     Past Surgical History:  Procedure Laterality Date  . NO PAST SURGERIES      Family History  Problem Relation Age of Onset  . AAA (abdominal aortic aneurysm) Mother   . Diabetes Maternal Grandmother   . Heart failure Maternal Grandmother   . Obesity Maternal Grandmother   . Arthritis Maternal Grandmother   . Thyroid disease Neg Hx     Social History   Tobacco Use  . Smoking status: Never Smoker  . Smokeless tobacco: Never Used  Vaping Use  . Vaping Use: Never used  Substance Use Topics  . Alcohol use: No  . Drug use: No    Allergies: No Known Allergies  Medications Prior to Admission  Medication Sig Dispense Refill Last Dose  . acetaminophen (TYLENOL) 325 MG tablet Take 2 tablets (650 mg total) by mouth every 6 (six) hours as needed. 30 tablet 0   . Prenatal Vit-Fe Fumarate-FA (MULTIVITAMIN-PRENATAL) 27-0.8 MG TABS tablet Take 1 tablet by mouth daily at 12 noon.     10/10 terconazole (TERAZOL 7) 0.4 % vaginal  cream Place 1 applicator vaginally at bedtime. (Patient not taking: Reported on 03/06/2020) 45 g 0     Review of Systems  Constitutional: Negative for chills and fever.  HENT: Negative for congestion and sore throat.   Respiratory: Positive for shortness of breath. Negative for cough.   Cardiovascular: Negative for chest pain.  Gastrointestinal: Positive for abdominal pain. Negative for constipation, diarrhea, nausea and vomiting.  Genitourinary: Negative for dysuria, hematuria, urgency, vaginal bleeding and vaginal discharge.   Physical Exam   Blood pressure 109/73, pulse 90, temperature 98.5 F (36.9 C), temperature source Oral, resp. rate 18, last menstrual period 08/26/2019, SpO2 100 %.  Physical Exam Vitals and nursing note reviewed.  Constitutional:      General: She is not in acute distress.    Appearance: She is well-developed.  HENT:     Head: Normocephalic and atraumatic.  Cardiovascular:     Rate and Rhythm: Normal rate and regular rhythm.     Heart sounds: Normal heart sounds. No murmur heard.   Pulmonary:  Effort: Pulmonary effort is normal. No respiratory distress.     Breath sounds: Normal breath sounds. No decreased breath sounds, wheezing, rhonchi or rales.  Abdominal:     Palpations: Abdomen is soft.     Tenderness: There is no abdominal tenderness.  Genitourinary:    Comments: VE: closed.thick Musculoskeletal:        General: Normal range of motion.  Skin:    General: Skin is warm and dry.  Neurological:     General: No focal deficit present.     Mental Status: She is alert and oriented to person, place, and time.  Psychiatric:        Mood and Affect: Mood normal.   EFM: 140 bpm, mod variability, + accels, no decels Toco: none  Results for orders placed or performed during the hospital encounter of 03/18/20 (from the past 24 hour(s))  Urinalysis, Routine w reflex microscopic     Status: Abnormal   Collection Time: 03/18/20  4:01 PM  Result Value  Ref Range   Color, Urine YELLOW YELLOW   APPearance CLEAR CLEAR   Specific Gravity, Urine 1.004 (L) 1.005 - 1.030   pH 7.0 5.0 - 8.0   Glucose, UA NEGATIVE NEGATIVE mg/dL   Hgb urine dipstick NEGATIVE NEGATIVE   Bilirubin Urine NEGATIVE NEGATIVE   Ketones, ur NEGATIVE NEGATIVE mg/dL   Protein, ur NEGATIVE NEGATIVE mg/dL   Nitrite NEGATIVE NEGATIVE   Leukocytes,Ua NEGATIVE NEGATIVE   DG Chest 2 View  Result Date: 03/18/2020 CLINICAL DATA:  Shortness of breath.  Twenty-nine weeks pregnant. EXAM: CHEST - 2 VIEW COMPARISON:  None. FINDINGS: The heart size and mediastinal contours are within normal limits. Both lungs are clear. The visualized skeletal structures are unremarkable. IMPRESSION: Normal examination. Electronically Signed   By: Beckie Salts M.D.   On: 03/18/2020 18:32   MAU Course  Procedures Orders Placed This Encounter  Procedures  . DG Chest 2 View    Standing Status:   Standing    Number of Occurrences:   1    Order Specific Question:   Reason for Exam (SYMPTOM  OR DIAGNOSIS REQUIRED)    Answer:   SOB, 29 weeks    Order Specific Question:   Is patient pregnant?    Answer:   Yes  . Urinalysis, Routine w reflex microscopic    Standing Status:   Standing    Number of Occurrences:   1  . Discharge patient    Order Specific Question:   Discharge disposition    Answer:   01-Home or Self Care [1]    Order Specific Question:   Discharge patient date    Answer:   03/18/2020   MDM Labs and CXR ordered and reviewed. No evidence of pneumonia or PE, SOB likely physiologic to advancing pregnancy. No signs of PTL or UTI. Likely having occ. BH ctx. Pt reassured and discussed return precautions. Stable for discharge home.   Assessment and Plan   1. [redacted] weeks gestation of pregnancy   2. NST (non-stress test) reactive   3. Shortness of breath due to pregnancy   4. Abdominal pain during pregnancy in third trimester    Discharge home Follow up at [facility] PTL precautions  Allergies  as of 03/18/2020   No Known Allergies     Medication List    STOP taking these medications   terconazole 0.4 % vaginal cream Commonly known as: Terazol 7     TAKE these medications   acetaminophen 325 MG tablet Commonly  known as: TYLENOL Take 2 tablets (650 mg total) by mouth every 6 (six) hours as needed.   multivitamin-prenatal 27-0.8 MG Tabs tablet Take 1 tablet by mouth daily at 12 noon.      Julianne Handler, CNM 03/18/2020, 6:55 PM

## 2020-03-20 ENCOUNTER — Ambulatory Visit (INDEPENDENT_AMBULATORY_CARE_PROVIDER_SITE_OTHER): Payer: BC Managed Care – PPO | Admitting: Endocrinology

## 2020-03-20 ENCOUNTER — Encounter: Payer: Self-pay | Admitting: Endocrinology

## 2020-03-20 ENCOUNTER — Other Ambulatory Visit: Payer: Self-pay

## 2020-03-20 VITALS — BP 100/70 | HR 108 | Ht 59.0 in | Wt 121.4 lb

## 2020-03-20 DIAGNOSIS — E0590 Thyrotoxicosis, unspecified without thyrotoxic crisis or storm: Secondary | ICD-10-CM

## 2020-03-20 LAB — T4, FREE: Free T4: 0.68 ng/dL (ref 0.60–1.60)

## 2020-03-20 LAB — TSH: TSH: 0.12 u[IU]/mL — ABNORMAL LOW (ref 0.35–4.50)

## 2020-03-20 NOTE — Patient Instructions (Signed)
Blood tests are requested for you today.  We'll let you know about the results.  Please come back for a follow-up appointment in 1 month.  

## 2020-03-20 NOTE — Progress Notes (Signed)
Subjective:    Patient ID: Katie Pratt, female    DOB: Dec 13, 1995, 24 y.o.   MRN: [id number]  HPI Pt returns for f/u of hyperthyroidism (dx'ed early 2021; she has never been on therapy for this; she has never had thyroid imaging; in 4/21, it significantly improved, without rx). She is at [redacted] weeks gestation now.  Pt was seen in ER yesterday, with palpitations and doe Past Medical History:  Diagnosis Date  . Anemia   . Eczema   . Hypothyroid     Past Surgical History:  Procedure Laterality Date  . NO PAST SURGERIES      Social History   Socioeconomic History  . Marital status: Single    Spouse name: Not on file  . Number of children: Not on file  . Years of education: Not on file  . Highest education level: Not on file  Occupational History  . Not on file  Tobacco Use  . Smoking status: Never Smoker  . Smokeless tobacco: Never Used  Vaping Use  . Vaping Use: Never used  Substance and Sexual Activity  . Alcohol use: No  . Drug use: No  . Sexual activity: Yes  Other Topics Concern  . Not on file  Social History Narrative  . Not on file   Social Determinants of Health   Financial Resource Strain:   . Difficulty of Paying Living Expenses:   Food Insecurity:   . Worried About Programme researcher, broadcasting/film/video in the Last Year:   . Barista in the Last Year:   Transportation Needs:   . Freight forwarder (Medical):   Marland Kitchen Lack of Transportation (Non-Medical):   Physical Activity:   . Days of Exercise per Week:   . Minutes of Exercise per Session:   Stress:   . Feeling of Stress :   Social Connections:   . Frequency of Communication with Friends and Family:   . Frequency of Social Gatherings with Friends and Family:   . Attends Religious Services:   . Active Member of Clubs or Organizations:   . Attends Banker Meetings:   Marland Kitchen Marital Status:   Intimate Partner Violence:   . Fear of Current or Ex-Partner:   . Emotionally Abused:   Marland Kitchen Physically  Abused:   . Sexually Abused:     Current Outpatient Medications on File Prior to Visit  Medication Sig Dispense Refill  . acetaminophen (TYLENOL) 325 MG tablet Take 2 tablets (650 mg total) by mouth every 6 (six) hours as needed. 30 tablet 0  . Prenatal Vit-Fe Fumarate-FA (MULTIVITAMIN-PRENATAL) 27-0.8 MG TABS tablet Take 1 tablet by mouth daily at 12 noon.     No current facility-administered medications on file prior to visit.    No Known Allergies  Family History  Problem Relation Age of Onset  . AAA (abdominal aortic aneurysm) Mother   . Diabetes Maternal Grandmother   . Heart failure Maternal Grandmother   . Obesity Maternal Grandmother   . Arthritis Maternal Grandmother   . Thyroid disease Neg Hx     BP 100/70   Pulse (!) 108   Ht 4\' 11"  (1.499 m)   Wt 121 lb 6.4 oz (55.1 kg)   LMP 08/26/2019   SpO2 96%   BMI 24.52 kg/m    Review of Systems Denies fever    Objective:   Physical Exam VITAL SIGNS:  See vs page GENERAL: no distress EYES: bilat proptosis.   NECK: Thyroid is slightly  and diffusely enlarged.  No thyroid nodule is palpable.  No palpable lymphadenopathy at the anterior neck.    Lab Results  Component Value Date   TSH 0.12 (L) 03/20/2020      Assessment & Plan:  Hyperthyroidism, worse.   Pregnancy: in this setting, she would need to be more hyperthyroid to benefit from rx.   Palpitations/doe: very unlikely to be thyroid-related.  These sxs are much more likely to be related to pregnancy.   Patient Instructions  Blood tests are requested for you today.  We'll let you know about the results.  Please come back for a follow-up appointment in 1 month.

## 2020-03-24 ENCOUNTER — Other Ambulatory Visit: Payer: Self-pay

## 2020-03-24 ENCOUNTER — Emergency Department (HOSPITAL_BASED_OUTPATIENT_CLINIC_OR_DEPARTMENT_OTHER)
Admission: EM | Admit: 2020-03-24 | Discharge: 2020-03-24 | Disposition: A | Discharge status: Home / Self Care | Payer: BC Managed Care – PPO | Attending: Emergency Medicine | Admitting: Emergency Medicine

## 2020-03-24 ENCOUNTER — Emergency Department (HOSPITAL_BASED_OUTPATIENT_CLINIC_OR_DEPARTMENT_OTHER): Payer: BC Managed Care – PPO

## 2020-03-24 ENCOUNTER — Encounter (HOSPITAL_BASED_OUTPATIENT_CLINIC_OR_DEPARTMENT_OTHER): Payer: Self-pay

## 2020-03-24 DIAGNOSIS — Z3A30 30 weeks gestation of pregnancy: Secondary | ICD-10-CM | POA: Insufficient documentation

## 2020-03-24 DIAGNOSIS — Z20822 Contact with and (suspected) exposure to covid-19: Secondary | ICD-10-CM | POA: Insufficient documentation

## 2020-03-24 DIAGNOSIS — R0602 Shortness of breath: Secondary | ICD-10-CM | POA: Diagnosis not present

## 2020-03-24 DIAGNOSIS — O26893 Other specified pregnancy related conditions, third trimester: Secondary | ICD-10-CM | POA: Diagnosis present

## 2020-03-24 LAB — BRAIN NATRIURETIC PEPTIDE: B Natriuretic Peptide: 41 pg/mL (ref 0.0–100.0)

## 2020-03-24 LAB — CBC WITH DIFFERENTIAL/PLATELET
Abs Immature Granulocytes: 0.05 10*3/uL (ref 0.00–0.07)
Basophils Absolute: 0 10*3/uL (ref 0.0–0.1)
Basophils Relative: 1 %
Eosinophils Absolute: 0.5 10*3/uL (ref 0.0–0.5)
Eosinophils Relative: 5 %
HCT: 33.7 % — ABNORMAL LOW (ref 36.0–46.0)
Hemoglobin: 11.5 g/dL — ABNORMAL LOW (ref 12.0–15.0)
Immature Granulocytes: 1 %
Lymphocytes Relative: 26 %
Lymphs Abs: 2.2 10*3/uL (ref 0.7–4.0)
MCH: 30.8 pg (ref 26.0–34.0)
MCHC: 34.1 g/dL (ref 30.0–36.0)
MCV: 90.3 fL (ref 80.0–100.0)
Monocytes Absolute: 0.4 10*3/uL (ref 0.1–1.0)
Monocytes Relative: 5 %
Neutro Abs: 5.2 10*3/uL (ref 1.7–7.7)
Neutrophils Relative %: 62 %
Platelets: 189 10*3/uL (ref 150–400)
RBC: 3.73 MIL/uL — ABNORMAL LOW (ref 3.87–5.11)
RDW: 12.7 % (ref 11.5–15.5)
WBC: 8.4 10*3/uL (ref 4.0–10.5)
nRBC: 0 % (ref 0.0–0.2)

## 2020-03-24 LAB — BASIC METABOLIC PANEL
Anion gap: 8 (ref 5–15)
BUN: <5 mg/dL — ABNORMAL LOW (ref 6–20)
CO2: 23 mmol/L (ref 22–32)
Calcium: 8.7 mg/dL — ABNORMAL LOW (ref 8.9–10.3)
Chloride: 104 mmol/L (ref 98–111)
Creatinine, Ser: 0.47 mg/dL (ref 0.44–1.00)
GFR calc Af Amer: >60 mL/min (ref >60)
GFR calc non Af Amer: >60 mL/min (ref >60)
Glucose, Bld: 100 mg/dL — ABNORMAL HIGH (ref 70–99)
Potassium: 4 mmol/L (ref 3.5–5.1)
Sodium: 135 mmol/L (ref 135–145)

## 2020-03-24 LAB — URINALYSIS, ROUTINE W REFLEX MICROSCOPIC
Bilirubin Urine: NEGATIVE
Glucose, UA: NEGATIVE mg/dL
Hgb urine dipstick: NEGATIVE
Ketones, ur: NEGATIVE mg/dL
Leukocytes,Ua: NEGATIVE
Nitrite: NEGATIVE
Protein, ur: NEGATIVE mg/dL
Specific Gravity, Urine: 1.01 (ref 1.005–1.030)
pH: 6.5 (ref 5.0–8.0)

## 2020-03-24 LAB — SARS CORONAVIRUS 2 BY RT PCR (HOSPITAL ORDER, PERFORMED IN ~~LOC~~ HOSPITAL LAB): SARS Coronavirus 2: NEGATIVE

## 2020-03-24 MED ORDER — IOHEXOL 350 MG/ML SOLN
100.0000 mL | Freq: Once | INTRAVENOUS | Status: AC | PRN
  Administered 2020-03-24: 67 mL via INTRAVENOUS

## 2020-03-24 MED ORDER — SODIUM CHLORIDE 0.9 % IV BOLUS
1000.0000 mL | Freq: Once | INTRAVENOUS | Status: AC
  Administered 2020-03-24: 1000 mL via INTRAVENOUS

## 2020-03-24 NOTE — ED Triage Notes (Signed)
C/o SOB x 10 days-states she is [redacted] week pregnant-seen by OB 6/29-NAD-steady gait

## 2020-03-24 NOTE — ED Provider Notes (Signed)
[facility] EMERGENCY DEPARTMENT Provider Note   CSN: [id number] Arrival date & time: 03/24/20  1449     History Chief Complaint  Patient presents with   Shortness of Breath    Katie Pratt is a 24 y.o. female.  Pt presents to the ED today with SOB.  The pt is [redacted] weeks pregnant.  She said she feels like her heart races and her breath cuts off.  The pt saw MAU on 6/26 for the SOB. CXR ok.  Non stress test for baby ok.  Pt was told sob was due to pregnancy.  The pt has mild hyperthyroidism and went to see endocrinology on 6/28.  TSH mildly low and T4 was nl.  Dr. Everardo All said it was not severe enough to treat during pregnancy.  Pt denies f/c.  No cough.        Past Medical History:  Diagnosis Date   Anemia    Eczema    Hypothyroid     Patient Active Problem List   Diagnosis Date Noted   Hyperthyroidism 01/17/2020   Encounter to establish care 12/03/2016   DYSMENORRHEA 04/05/2010   IRREGULAR MENSES 01/16/2009   Eczema 01/16/2009    Past Surgical History:  Procedure Laterality Date   NO PAST SURGERIES       OB History    Gravida  1   Para      Term      Preterm      AB      Living        SAB      TAB      Ectopic      Multiple      Live Births              Family History  Problem Relation Age of Onset   AAA (abdominal aortic aneurysm) Mother    Diabetes Maternal Grandmother    Heart failure Maternal Grandmother    Obesity Maternal Grandmother    Arthritis Maternal Grandmother    Thyroid disease Neg Hx     Social History   Tobacco Use   Smoking status: Never Smoker   Smokeless tobacco: Never Used  Vaping Use   Vaping Use: Never used  Substance Use Topics   Alcohol use: No   Drug use: No    Home Medications Prior to Admission medications   Medication Sig Start Date End Date Taking? Authorizing Provider  acetaminophen (TYLENOL) 325 MG tablet Take 2 tablets (650 mg total) by mouth every 6 (six)  hours as needed. 01/13/20   Mesner, Barbara Cower, MD  Prenatal Vit-Fe Fumarate-FA (MULTIVITAMIN-PRENATAL) 27-0.8 MG TABS tablet Take 1 tablet by mouth daily at 12 noon.    [provider]    Allergies    Patient has no known allergies.  Review of Systems   Review of Systems  Respiratory: Positive for shortness of breath.   All other systems reviewed and are negative.   Physical Exam Updated Vital Signs BP 115/73    Pulse (!) 101    Temp 98.2 F (36.8 C) (Oral)    Resp 17    LMP 08/26/2019    SpO2 100%   Physical Exam Vitals and nursing note reviewed.  Constitutional:      Appearance: She is well-developed.  HENT:     Head: Normocephalic and atraumatic.     Mouth/Throat:     Mouth: Mucous membranes are moist.     Pharynx: Oropharynx is clear.  Eyes:  Extraocular Movements: Extraocular movements intact.     Pupils: Pupils are equal, round, and reactive to light.  Cardiovascular:     Rate and Rhythm: Regular rhythm. Tachycardia present.  Pulmonary:     Effort: Pulmonary effort is normal.     Breath sounds: Normal breath sounds.  Abdominal:     General: Bowel sounds are normal.     Comments: Gravid abdomen.    Musculoskeletal:        General: Normal range of motion.     Cervical back: Normal range of motion and neck supple.  Skin:    General: Skin is warm.     Capillary Refill: Capillary refill takes less than 2 seconds.  Neurological:     General: No focal deficit present.     Mental Status: She is alert and oriented to person, place, and time.  Psychiatric:        Mood and Affect: Mood normal.        Behavior: Behavior normal.     ED Results / Procedures / Treatments   Labs (all labs ordered are listed, but only abnormal results are displayed) Labs Reviewed  BASIC METABOLIC PANEL - Abnormal; Notable for the following components:      Result Value   Glucose, Bld 100 (*)    BUN <5 (*)    Calcium 8.7 (*)    All other components within normal limits  CBC  WITH DIFFERENTIAL/PLATELET - Abnormal; Notable for the following components:   RBC 3.73 (*)    Hemoglobin 11.5 (*)    HCT 33.7 (*)    All other components within normal limits  SARS CORONAVIRUS 2 BY RT PCR (HOSPITAL ORDER, PERFORMED IN [facility])  BRAIN NATRIURETIC PEPTIDE  URINALYSIS, ROUTINE W REFLEX MICROSCOPIC    EKG EKG Interpretation  Date/Time:  Friday March 24 2020 14:53:08 EDT Ventricular Rate:  103 PR Interval:  94 QRS Duration: 66 QT Interval:  308 QTC Calculation: 403 R Axis:   68 Text Interpretation: Sinus tachycardia with short PR with Premature supraventricular complexes Nonspecific T wave abnormality Abnormal ECG No old tracing to compare Confirmed by Jacalyn Lefevre [phone]) on 03/24/2020 3:15:03 PM   Radiology CT Angio Chest PE W and/or Wo Contrast  Result Date: 03/24/2020 CLINICAL DATA:  Increased shortness of breath [redacted] weeks pregnant EXAM: CT ANGIOGRAPHY CHEST WITH CONTRAST TECHNIQUE: Multidetector CT imaging of the chest was performed using the standard protocol during bolus administration of intravenous contrast. Multiplanar CT image reconstructions and MIPs were obtained to evaluate the vascular anatomy. CONTRAST:  57mL OMNIPAQUE IOHEXOL 350 MG/ML SOLN COMPARISON:  Chest x-ray 03/18/2020 FINDINGS: Cardiovascular: Satisfactory opacification of the pulmonary arteries to the segmental level. No evidence of pulmonary embolism. Normal heart size. No pericardial effusion. Nonaneurysmal aorta. No dissection is seen. Mediastinum/Nodes: No enlarged mediastinal, hilar, or axillary lymph nodes. Thyroid gland, trachea, and esophagus demonstrate no significant findings. Lungs/Pleura: Lungs are clear. No pleural effusion or pneumothorax. Upper Abdomen: No acute abnormality. Musculoskeletal: No chest wall abnormality. No acute or significant osseous findings. Review of the MIP images confirms the above findings. IMPRESSION: Negative. No CT evidence for acute pulmonary  embolus or aortic dissection. Clear lung fields. Electronically Signed   By: Jasmine Pang M.D.   On: 03/24/2020 18:07    Procedures Procedures (including critical care time)  Medications Ordered in ED Medications  sodium chloride 0.9 % bolus 1,000 mL (1,000 mLs Intravenous New Bag/Given 03/24/20 1615)  iohexol (OMNIPAQUE) 350 MG/ML injection 100 mL (67  mLs Intravenous Contrast Given 03/24/20 1712)    ED Course  I have reviewed the triage vital signs and the nursing notes.  Pertinent labs & imaging results that were available during my care of the patient were reviewed by me and considered in my medical decision making (see chart for details).    MDM Rules/Calculators/A&P                           Work up today is nl.  CT angio chest shows nothing acute.  Pt does not appear to be sob even with walking.  She is a small person and has a big baby, so her lungs are not able to fully expand.  Pt is stable for d/c.  Return if worse.   Final Clinical Impression(s) / ED Diagnoses Final diagnoses:  Shortness of breath  [redacted] weeks gestation of pregnancy    Rx / DC Orders ED Discharge Orders    None       Jacalyn Lefevre, MD 03/24/20 1818

## 2020-03-30 ENCOUNTER — Inpatient Hospital Stay (HOSPITAL_COMMUNITY)
Admission: AD | Admit: 2020-03-30 | Discharge: 2020-03-30 | Disposition: A | Discharge status: Home / Self Care | Payer: BC Managed Care – PPO | Attending: Obstetrics & Gynecology | Admitting: Obstetrics & Gynecology

## 2020-03-30 ENCOUNTER — Other Ambulatory Visit: Payer: Self-pay

## 2020-03-30 ENCOUNTER — Encounter (HOSPITAL_COMMUNITY): Payer: Self-pay | Admitting: Obstetrics & Gynecology

## 2020-03-30 DIAGNOSIS — E0590 Thyrotoxicosis, unspecified without thyrotoxic crisis or storm: Secondary | ICD-10-CM | POA: Insufficient documentation

## 2020-03-30 DIAGNOSIS — O26893 Other specified pregnancy related conditions, third trimester: Secondary | ICD-10-CM | POA: Diagnosis not present

## 2020-03-30 DIAGNOSIS — Z79899 Other long term (current) drug therapy: Secondary | ICD-10-CM | POA: Insufficient documentation

## 2020-03-30 DIAGNOSIS — K219 Gastro-esophageal reflux disease without esophagitis: Secondary | ICD-10-CM | POA: Insufficient documentation

## 2020-03-30 DIAGNOSIS — E039 Hypothyroidism, unspecified: Secondary | ICD-10-CM | POA: Insufficient documentation

## 2020-03-30 DIAGNOSIS — N898 Other specified noninflammatory disorders of vagina: Secondary | ICD-10-CM | POA: Diagnosis not present

## 2020-03-30 DIAGNOSIS — O99613 Diseases of the digestive system complicating pregnancy, third trimester: Secondary | ICD-10-CM | POA: Insufficient documentation

## 2020-03-30 DIAGNOSIS — R0602 Shortness of breath: Secondary | ICD-10-CM | POA: Diagnosis not present

## 2020-03-30 DIAGNOSIS — R102 Pelvic and perineal pain: Secondary | ICD-10-CM | POA: Insufficient documentation

## 2020-03-30 DIAGNOSIS — Z3A31 31 weeks gestation of pregnancy: Secondary | ICD-10-CM | POA: Diagnosis not present

## 2020-03-30 DIAGNOSIS — R5383 Other fatigue: Secondary | ICD-10-CM | POA: Insufficient documentation

## 2020-03-30 DIAGNOSIS — O99283 Endocrine, nutritional and metabolic diseases complicating pregnancy, third trimester: Secondary | ICD-10-CM | POA: Diagnosis not present

## 2020-03-30 DIAGNOSIS — O212 Late vomiting of pregnancy: Secondary | ICD-10-CM | POA: Insufficient documentation

## 2020-03-30 LAB — COMPREHENSIVE METABOLIC PANEL
ALT: 39 U/L (ref 0–44)
AST: 49 U/L — ABNORMAL HIGH (ref 15–41)
Albumin: 2.6 g/dL — ABNORMAL LOW (ref 3.5–5.0)
Alkaline Phosphatase: 177 U/L — ABNORMAL HIGH (ref 38–126)
Anion gap: 11 (ref 5–15)
BUN: <5 mg/dL — ABNORMAL LOW (ref 6–20)
CO2: 20 mmol/L — ABNORMAL LOW (ref 22–32)
Calcium: 8.4 mg/dL — ABNORMAL LOW (ref 8.9–10.3)
Chloride: 102 mmol/L (ref 98–111)
Creatinine, Ser: 0.55 mg/dL (ref 0.44–1.00)
GFR calc Af Amer: >60 mL/min (ref >60)
GFR calc non Af Amer: >60 mL/min (ref >60)
Glucose, Bld: 99 mg/dL (ref 70–99)
Potassium: 3.2 mmol/L — ABNORMAL LOW (ref 3.5–5.1)
Sodium: 133 mmol/L — ABNORMAL LOW (ref 135–145)
Total Bilirubin: 0.7 mg/dL (ref 0.3–1.2)
Total Protein: 6.3 g/dL — ABNORMAL LOW (ref 6.5–8.1)

## 2020-03-30 LAB — CBC
HCT: 33.6 % — ABNORMAL LOW (ref 36.0–46.0)
Hemoglobin: 11.4 g/dL — ABNORMAL LOW (ref 12.0–15.0)
MCH: 30 pg (ref 26.0–34.0)
MCHC: 33.9 g/dL (ref 30.0–36.0)
MCV: 88.4 fL (ref 80.0–100.0)
Platelets: 219 10*3/uL (ref 150–400)
RBC: 3.8 MIL/uL — ABNORMAL LOW (ref 3.87–5.11)
RDW: 12.4 % (ref 11.5–15.5)
WBC: 7.6 10*3/uL (ref 4.0–10.5)
nRBC: 0 % (ref 0.0–0.2)

## 2020-03-30 LAB — URINALYSIS, ROUTINE W REFLEX MICROSCOPIC
Bilirubin Urine: NEGATIVE
Glucose, UA: >=500 mg/dL — AB
Hgb urine dipstick: NEGATIVE
Ketones, ur: 5 mg/dL — AB
Leukocytes,Ua: NEGATIVE
Nitrite: NEGATIVE
Protein, ur: NEGATIVE mg/dL
Specific Gravity, Urine: 1.008 (ref 1.005–1.030)
pH: 6 (ref 5.0–8.0)

## 2020-03-30 LAB — AMNISURE RUPTURE OF MEMBRANE (ROM) NOT AT ARMC: Amnisure ROM: NEGATIVE

## 2020-03-30 MED ORDER — ALUM & MAG HYDROXIDE-SIMETH 200-200-20 MG/5ML PO SUSP
30.0000 mL | Freq: Once | ORAL | Status: AC
  Administered 2020-03-30: 30 mL via ORAL
  Filled 2020-03-30: qty 30

## 2020-03-30 MED ORDER — FAMOTIDINE IN NACL 20-0.9 MG/50ML-% IV SOLN
20.0000 mg | Freq: Once | INTRAVENOUS | Status: AC
  Administered 2020-03-30: 20 mg via INTRAVENOUS
  Filled 2020-03-30: qty 50

## 2020-03-30 MED ORDER — FAMOTIDINE 20 MG PO TABS
20.0000 mg | ORAL_TABLET | Freq: Two times a day (BID) | ORAL | 0 refills | Status: DC
Start: 2020-03-30 — End: 2020-05-02

## 2020-03-30 MED ORDER — LACTATED RINGERS IV BOLUS
1000.0000 mL | Freq: Once | INTRAVENOUS | Status: AC
  Administered 2020-03-30: 1000 mL via INTRAVENOUS

## 2020-03-30 NOTE — Discharge Instructions (Signed)
Food Choices for Gastroesophageal Reflux Disease, Adult When you have gastroesophageal reflux disease (GERD), the foods you eat and your eating habits are very important. Choosing the right foods can help ease the discomfort of GERD. Consider working with a diet and nutrition specialist (dietitian) to help you make healthy food choices. What general guidelines should I follow?  Eating plan  Choose healthy foods low in fat, such as fruits, vegetables, whole grains, low-fat dairy products, and lean meat, fish, and poultry.  Eat frequent, small meals instead of three large meals each day. Eat your meals slowly, in a relaxed setting. Avoid bending over or lying down until 2-3 hours after eating.  Limit high-fat foods such as fatty meats or fried foods.  Limit your intake of oils, butter, and shortening to less than 8 teaspoons each day.  Avoid the following: ? Foods that cause symptoms. These may be different for different people. Keep a food diary to keep track of foods that cause symptoms. ? Alcohol. ? Drinking large amounts of liquid with meals. ? Eating meals during the 2-3 hours before bed.  Cook foods using methods other than frying. This may include baking, grilling, or broiling. Lifestyle  Maintain a healthy weight. Ask your health care provider what weight is healthy for you. If you need to lose weight, work with your health care provider to do so safely.  Exercise for at least 30 minutes on 5 or more days each week, or as told by your health care provider.  Avoid wearing clothes that fit tightly around your waist and chest.  Do not use any products that contain nicotine or tobacco, such as cigarettes and e-cigarettes. If you need help quitting, ask your health care provider.  Sleep with the head of your bed raised. Use a wedge under the mattress or blocks under the bed frame to raise the head of the bed. What foods are not recommended? The items listed may not be a complete  list. Talk with your dietitian about what dietary choices are best for you. Grains Pastries or quick breads with added fat. French toast. Vegetables Deep fried vegetables. French fries. Any vegetables prepared with added fat. Any vegetables that cause symptoms. For some people this may include tomatoes and tomato products, chili peppers, onions and garlic, and horseradish. Fruits Any fruits prepared with added fat. Any fruits that cause symptoms. For some people this may include citrus fruits, such as oranges, grapefruit, pineapple, and lemons. Meats and other protein foods High-fat meats, such as fatty beef or pork, hot dogs, ribs, ham, sausage, salami and bacon. Fried meat or protein, including fried fish and fried chicken. Nuts and nut butters. Dairy Whole milk and chocolate milk. Sour cream. Cream. Ice cream. Cream cheese. Milk shakes. Beverages Coffee and tea, with or without caffeine. Carbonated beverages. Sodas. Energy drinks. Fruit juice made with acidic fruits (such as orange or grapefruit). Tomato juice. Alcoholic drinks. Fats and oils Butter. Margarine. Shortening. Ghee. Sweets and desserts Chocolate and cocoa. Donuts. Seasoning and other foods Pepper. Peppermint and spearmint. Any condiments, herbs, or seasonings that cause symptoms. For some people, this may include curry, hot sauce, or vinegar-based salad dressings. Summary  When you have gastroesophageal reflux disease (GERD), food and lifestyle choices are very important to help ease the discomfort of GERD.  Eat frequent, small meals instead of three large meals each day. Eat your meals slowly, in a relaxed setting. Avoid bending over or lying down until 2-3 hours after eating.  Limit high-fat   foods such as fatty meat or fried foods. This information is not intended to replace advice given to you by your health care provider. Make sure you discuss any questions you have with your health care provider. Document Revised:  12/31/2018 Document Reviewed: 09/10/2016 Elsevier Patient Education  2020 Elsevier Inc.  

## 2020-03-30 NOTE — MAU Provider Note (Signed)
History     CSN: [id number]  Arrival date and time: 03/30/20 1407   First Provider Initiated Contact with Patient 03/30/20 1522      Chief Complaint  Patient presents with  . Emesis   HPI Katie Pratt is a 24 y.o. G1P0 at [redacted]w[redacted]d who presents to MAU with chief complaint of nausea and vomiting with one episode of hemoptysis. Patient estimates she has vomited 3-4 times in the past 24 hours. She endorses new onset chest pain "like a really bad heartburn. She denies aggravating or alleviating factors. She has not taken medication beyond a single dose of Tums.  Patient also endorses two episodes of leaking of fluid at 0600 and 0900 today. She endorses "way more increased" fetal movement. She denies contractions, vaginal bleeding, leaking of fluid, decreased fetal movement, fever, falls, or recent illness.   Patient also has weekly Endocrinology appointments for Hyperthyroidism. She has an outpatient Cardiology appointment tomorrow.She receives care with [facility] and her next appointment is 07/14.   OB History    Gravida  1   Para      Term      Preterm      AB      Living        SAB      TAB      Ectopic      Multiple      Live Births              Past Medical History:  Diagnosis Date  . Anemia   . Eczema   . Hypothyroid     Past Surgical History:  Procedure Laterality Date  . NO PAST SURGERIES      Family History  Problem Relation Age of Onset  . AAA (abdominal aortic aneurysm) Mother   . Diabetes Maternal Grandmother   . Heart failure Maternal Grandmother   . Obesity Maternal Grandmother   . Arthritis Maternal Grandmother   . Thyroid disease Neg Hx     Social History   Tobacco Use  . Smoking status: Never Smoker  . Smokeless tobacco: Never Used  Vaping Use  . Vaping Use: Never used  Substance Use Topics  . Alcohol use: No  . Drug use: No    Allergies: No Known Allergies  Medications Prior to Admission  Medication Sig Dispense Refill Last  Dose  . acetaminophen (TYLENOL) 325 MG tablet Take 2 tablets (650 mg total) by mouth every 6 (six) hours as needed. 30 tablet 0 Past Month at Unknown time  . calcium carbonate (TUMS - DOSED IN MG ELEMENTAL CALCIUM) 500 MG chewable tablet Chew 1 tablet by mouth daily.   03/30/2020 at 0400  . Prenatal Vit-Fe Fumarate-FA (MULTIVITAMIN-PRENATAL) 27-0.8 MG TABS tablet Take 1 tablet by mouth daily at 12 noon.   03/29/2020 at Unknown time    Review of Systems  Constitutional: Positive for fatigue. Negative for fever.  Cardiovascular: Negative for palpitations and leg swelling.  Gastrointestinal: Positive for nausea and vomiting. Negative for abdominal pain.  Genitourinary: Positive for pelvic pain and vaginal discharge. Negative for dysuria and vaginal bleeding.  Musculoskeletal: Negative for back pain.  All other systems reviewed and are negative.  Physical Exam   Blood pressure 120/77, pulse (!) 115, temperature 99 F (37.2 C), temperature source Oral, resp. rate 16, height 4\' 11"  (1.499 m), weight 54.9 kg, last menstrual period 08/26/2019, SpO2 100 %.  Physical Exam Vitals and nursing note reviewed. Exam conducted with a chaperone present.  Constitutional:  Appearance: Normal appearance.  Cardiovascular:     Rate and Rhythm: Tachycardia present.     Pulses: Normal pulses.     Heart sounds: Normal heart sounds.  Pulmonary:     Effort: Pulmonary effort is normal.     Breath sounds: Normal breath sounds.  Abdominal:     Comments: Gravid  Genitourinary:    Comments: Cervix visually closed. Negative pooling Skin:    General: Skin is warm and dry.     Capillary Refill: Capillary refill takes less than 2 seconds.  Neurological:     General: No focal deficit present.     Mental Status: She is alert.  Psychiatric:        Mood and Affect: Mood normal.        Thought Content: Thought content normal.     MAU Course/MDM  Procedures  --Patient is s/p multiple MAU and ED visits for  evaluation of chest pain, shortness of breath, and activity intolerance --S/p Ed Cardiology evaluation with outpatient appointment tomorrow --Patient reports resolution of acid reflex pain following GI Cocktail --Reviewed recommendation for diet changes to mitigate GERD --Intact amniotic sac: negative pooling, negative fern, negative Amnisure --Reactive tracing: baseline 140, moderate variability, positive accels, no decels --Toco: rare contractions --Discussed hemodilution in early third trimester. Advised increasing PO water intake, limit caffeine, consider maternity belt, slower position changes, elevating feet when at rest --Abnormal glucose in urine, WNL on serum reading  Orders Placed This Encounter  Procedures  . Urinalysis, Routine w reflex microscopic  . Amnisure rupture of membrane (rom)not at [facility]  . CBC  . Comprehensive metabolic panel   Meds ordered this encounter  Medications  . alum & mag hydroxide-simeth (MAALOX/MYLANTA) 200-200-20 MG/5ML suspension 30 mL  . famotidine (PEPCID) IVPB 20 mg premix  . lactated ringers bolus 1,000 mL  . famotidine (PEPCID) 20 MG tablet    Sig: Take 1 tablet (20 mg total) by mouth 2 (two) times daily.    Dispense:  30 tablet    Refill:  0    Order Specific Question:   Supervising Provider    Answer:   Warden Fillers [[id number]]   Patient Vitals for the past 24 hrs:  BP Temp Temp src Pulse Resp SpO2 Height Weight  03/30/20 1645 121/71 -- -- 97 16 100 % -- --  03/30/20 1450 120/77 -- -- (!) 115 -- 100 % -- --  03/30/20 1449 127/84 99 F (37.2 C) Oral (!) 106 16 100 % 4\' 11"  (1.499 m) 54.9 kg   Results for orders placed or performed during the hospital encounter of 03/30/20 (from the past 24 hour(s))  Urinalysis, Routine w reflex microscopic     Status: Abnormal   Collection Time: 03/30/20  2:39 PM  Result Value Ref Range   Color, Urine YELLOW YELLOW   APPearance CLEAR CLEAR   Specific Gravity, Urine 1.008 1.005 - 1.030   pH 6.0 5.0  - 8.0   Glucose, UA >=500 (A) NEGATIVE mg/dL   Hgb urine dipstick NEGATIVE NEGATIVE   Bilirubin Urine NEGATIVE NEGATIVE   Ketones, ur 5 (A) NEGATIVE mg/dL   Protein, ur NEGATIVE NEGATIVE mg/dL   Nitrite NEGATIVE NEGATIVE   Leukocytes,Ua NEGATIVE NEGATIVE   RBC / HPF 0-5 0 - 5 RBC/hpf   WBC, UA 0-5 0 - 5 WBC/hpf   Bacteria, UA RARE (A) NONE SEEN   Squamous Epithelial / LPF 0-5 0 - 5   Mucus PRESENT   Amnisure rupture of membrane (rom)not at  [facility]     Status: None   Collection Time: 03/30/20  3:23 PM  Result Value Ref Range   Amnisure ROM NEGATIVE   CBC     Status: Abnormal   Collection Time: 03/30/20  3:42 PM  Result Value Ref Range   WBC 7.6 4.0 - 10.5 K/uL   RBC 3.80 (L) 3.87 - 5.11 MIL/uL   Hemoglobin 11.4 (L) 12.0 - 15.0 g/dL   HCT 32.6 (L) 36 - 46 %   MCV 88.4 80.0 - 100.0 fL   MCH 30.0 26.0 - 34.0 pg   MCHC 33.9 30.0 - 36.0 g/dL   RDW 71.2 45.8 - 09.9 %   Platelets 219 150 - 400 K/uL   nRBC 0.0 0.0 - 0.2 %  Comprehensive metabolic panel     Status: Abnormal   Collection Time: 03/30/20  3:42 PM  Result Value Ref Range   Sodium 133 (L) 135 - 145 mmol/L   Potassium 3.2 (L) 3.5 - 5.1 mmol/L   Chloride 102 98 - 111 mmol/L   CO2 20 (L) 22 - 32 mmol/L   Glucose, Bld 99 70 - 99 mg/dL   BUN <5 (L) 6 - 20 mg/dL   Creatinine, Ser 8.33 0.44 - 1.00 mg/dL   Calcium 8.4 (L) 8.9 - 10.3 mg/dL   Total Protein 6.3 (L) 6.5 - 8.1 g/dL   Albumin 2.6 (L) 3.5 - 5.0 g/dL   AST 49 (H) 15 - 41 U/L   ALT 39 0 - 44 U/L   Alkaline Phosphatase 177 (H) 38 - 126 U/L   Total Bilirubin 0.7 0.3 - 1.2 mg/dL   GFR calc non Af Amer >60 >60 mL/min   GFR calc Af Amer >60 >60 mL/min   Anion gap 11 5 - 15   Assessment and Plan  --24 y.o. G1P0 at [redacted]w[redacted]d  --Reactive tracing, closed cervix, intact amniotic sac --Patient denies pain prior to discharge, s/p treatment for GERD --Outpatient rx for reflux --Discharge home in stable condition  F/U: --Cardiology clinic appointment tomorrow --[facility] LOB  07/14  Calvert Cantor, CNM 03/30/2020, 8:56 PM

## 2020-03-30 NOTE — MAU Note (Signed)
.   Katie Pratt is a 24 y.o. at [redacted]w[redacted]d here in MAU reporting: Nausea and vomiting x3 days. Patient reports that she has thrown up x3 in the past 24 hrs. Has not taken any meds for nausea but did take tums at 0400. No VB. Patient reports LOF the size of a mini pancake at 0600 this morning and a another gush at 0900. Endorses increased fetal movement for the past two days. States she has chest pain like "someone stepping on her chest", but she states she thinks it is really bad heartburn.   Pain score:  Chest- 9 Cramping- 7 Vitals:   03/30/20 1449  BP: 127/84  Pulse: (!) 106  Resp: 16  Temp: 99 F (37.2 C)  SpO2: 100%     FHT:156 Lab orders placed from triage: UA

## 2020-03-31 ENCOUNTER — Encounter: Payer: Self-pay | Admitting: Cardiovascular Disease

## 2020-03-31 ENCOUNTER — Ambulatory Visit (INDEPENDENT_AMBULATORY_CARE_PROVIDER_SITE_OTHER): Payer: BC Managed Care – PPO | Admitting: Cardiovascular Disease

## 2020-03-31 DIAGNOSIS — R002 Palpitations: Secondary | ICD-10-CM | POA: Diagnosis not present

## 2020-03-31 NOTE — Patient Instructions (Signed)
Medication Instructions:  Your physician recommends that you continue on your current medications as directed. Please refer to the Current Medication list given to you today.  *If you need a refill on your cardiac medications before your next appointment, please call your pharmacy*  Lab Work: NONE   Testing/Procedures: NONE   Follow-Up: At [facility], you and your health needs are our priority.  As part of our continuing mission to provide you with exceptional heart care, we have created designated Provider Care Teams.  These Care Teams include your primary Cardiologist (physician) and Advanced Practice Providers (APPs -  Physician Assistants and Nurse Practitioners) who all work together to provide you with the care you need, when you need it.  We recommend signing up for the patient portal called "MyChart".  Sign up information is provided on this After Visit Summary.  MyChart is used to connect with patients for Virtual Visits (Telemedicine).  Patients are able to view lab/test results, encounter notes, upcoming appointments, etc.  Non-urgent messages can be sent to your provider as well.   To learn more about what you can do with MyChart, go to [email or web address].    Your next appointment:   3 month(s)  The format for your next appointment:   In Person  Provider:   You may see DR BERRY  or one of the following Advanced Practice Providers on your designated Care Team:    Luke Kilroy, PA-C  Callie Goodrich, PA-C  Jesse Cleaver, FNP    

## 2020-03-31 NOTE — Progress Notes (Signed)
03/31/2020 Colon Flattery   1996-07-31  [id number]  Primary Physician Avanell Shackleton, NP-C Primary Cardiologist: Runell Gess MD Milagros Loll, [place], [place]  HPI:  Katie Pratt is a 24 y.o. single African-American female with no children but is [redacted] weeks pregnant.  She was referred by Nigel Bridgeman, CNM for palpitations.  She has no other medical conditions.  She does does not smoke or drink alcohol.  She does not drink caffeine.  Recent blood work did show a TSH of 0.12 suggesting that she is hypothyroid.  She had palpitations that began towards the beginning of her pregnancy and is gotten worse towards the end.  They occur on a daily basis.   Current Meds  Medication Sig  . acetaminophen (TYLENOL) 325 MG tablet Take 2 tablets (650 mg total) by mouth every 6 (six) hours as needed.  . calcium carbonate (TUMS - DOSED IN MG ELEMENTAL CALCIUM) 500 MG chewable tablet Chew 1 tablet by mouth daily.  . famotidine (PEPCID) 20 MG tablet Take 1 tablet (20 mg total) by mouth 2 (two) times daily.  . Prenatal Vit-Fe Fumarate-FA (MULTIVITAMIN-PRENATAL) 27-0.8 MG TABS tablet Take 1 tablet by mouth daily at 12 noon.     No Known Allergies  Social History   Socioeconomic History  . Marital status: Single    Spouse name: Not on file  . Number of children: Not on file  . Years of education: Not on file  . Highest education level: Not on file  Occupational History  . Not on file  Tobacco Use  . Smoking status: Never Smoker  . Smokeless tobacco: Never Used  Vaping Use  . Vaping Use: Never used  Substance and Sexual Activity  . Alcohol use: No  . Drug use: No  . Sexual activity: Not Currently  Other Topics Concern  . Not on file  Social History Narrative  . Not on file   Social Determinants of Health   Financial Resource Strain:   . Difficulty of Paying Living Expenses:   Food Insecurity:   . Worried About Programme researcher, broadcasting/film/video in the Last Year:   . Barista in the Last  Year:   Transportation Needs:   . Freight forwarder (Medical):   Marland Kitchen Lack of Transportation (Non-Medical):   Physical Activity:   . Days of Exercise per Week:   . Minutes of Exercise per Session:   Stress:   . Feeling of Stress :   Social Connections:   . Frequency of Communication with Friends and Family:   . Frequency of Social Gatherings with Friends and Family:   . Attends Religious Services:   . Active Member of Clubs or Organizations:   . Attends Banker Meetings:   Marland Kitchen Marital Status:   Intimate Partner Violence:   . Fear of Current or Ex-Partner:   . Emotionally Abused:   Marland Kitchen Physically Abused:   . Sexually Abused:      Review of Systems: General: negative for chills, fever, night sweats or weight changes.  Cardiovascular: negative for chest pain, dyspnea on exertion, edema, orthopnea, palpitations, paroxysmal nocturnal dyspnea or shortness of breath Dermatological: negative for rash Respiratory: negative for cough or wheezing Urologic: negative for hematuria Abdominal: negative for nausea, vomiting, diarrhea, bright red blood per rectum, melena, or hematemesis Neurologic: negative for visual changes, syncope, or dizziness All other systems reviewed and are otherwise negative except as noted above.    Blood pressure 120/74, pulse 84,  height 4\' 11"  (1.499 m), weight 123 lb (55.8 kg), last menstrual period 08/26/2019.  General appearance: alert and no distress Neck: no adenopathy, no carotid bruit, no JVD, supple, symmetrical, trachea midline and thyroid not enlarged, symmetric, no tenderness/mass/nodules Lungs: clear to auscultation bilaterally Heart: regular rate and rhythm, S1, S2 normal, no murmur, click, rub or gallop Extremities: extremities normal, atraumatic, no cyanosis or edema Pulses: 2+ and symmetric Skin: Skin color, texture, turgor normal. No rashes or lesions Neurologic: Alert and oriented X 3, normal strength and tone. Normal symmetric  reflexes. Normal coordination and gait  EKG not performed today  ASSESSMENT AND PLAN:   Palpitations Ms. Remley was referred to me by Gaynell Face, CNM for palpitations.  She is [redacted] weeks pregnant.  She had some palpitations at the beginning of her pregnancy but they become worse more recently.  Her TSH is very low suggesting she is hyperthyroid.  She still gets palpitations on a daily basis.  I think she needs her thyroid addressed by her PCP.  I will see her back in 3 months and if she still having palpitations we will do a more thorough work-up including an event monitor to further characterize her palpitations.      Nigel Bridgeman MD FACP,FACC,FAHA, [facility] 03/31/2020 10:04 AM

## 2020-03-31 NOTE — Assessment & Plan Note (Signed)
Katie Pratt was referred to me by Nigel Bridgeman, CNM for palpitations.  She is [redacted] weeks pregnant.  She had some palpitations at the beginning of her pregnancy but they become worse more recently.  Her TSH is very low suggesting she is hyperthyroid.  She still gets palpitations on a daily basis.  I think she needs her thyroid addressed by her PCP.  I will see her back in 3 months and if she still having palpitations we will do a more thorough work-up including an event monitor to further characterize her palpitations.

## 2020-04-05 ENCOUNTER — Telehealth: Payer: Self-pay

## 2020-04-05 NOTE — Telephone Encounter (Signed)
LMTCb to reschedule 04/24/20 appointment- MD out of the office

## 2020-04-24 ENCOUNTER — Telehealth: Payer: Self-pay | Admitting: Family Medicine

## 2020-04-24 ENCOUNTER — Encounter: Payer: Self-pay | Admitting: Internal Medicine

## 2020-04-24 ENCOUNTER — Ambulatory Visit: Payer: BC Managed Care – PPO | Admitting: Endocrinology

## 2020-04-24 NOTE — Telephone Encounter (Signed)
Dismissal letter in guarantor [employer]

## 2020-04-27 ENCOUNTER — Other Ambulatory Visit: Payer: Self-pay

## 2020-04-27 ENCOUNTER — Inpatient Hospital Stay (HOSPITAL_COMMUNITY)
Admission: AD | Admit: 2020-04-27 | Discharge: 2020-05-02 | DRG: 805 | Disposition: A | Discharge status: Home / Self Care | Payer: BC Managed Care – PPO | Attending: Obstetrics and Gynecology | Admitting: Obstetrics and Gynecology

## 2020-04-27 ENCOUNTER — Encounter (HOSPITAL_COMMUNITY): Payer: Self-pay | Admitting: Obstetrics and Gynecology

## 2020-04-27 DIAGNOSIS — O9902 Anemia complicating childbirth: Principal | ICD-10-CM | POA: Diagnosis present

## 2020-04-27 DIAGNOSIS — O283 Abnormal ultrasonic finding on antenatal screening of mother: Secondary | ICD-10-CM | POA: Diagnosis not present

## 2020-04-27 DIAGNOSIS — Z3A35 35 weeks gestation of pregnancy: Secondary | ICD-10-CM | POA: Diagnosis not present

## 2020-04-27 DIAGNOSIS — D509 Iron deficiency anemia, unspecified: Secondary | ICD-10-CM | POA: Diagnosis present

## 2020-04-27 DIAGNOSIS — O4700 False labor before 37 completed weeks of gestation, unspecified trimester: Secondary | ICD-10-CM | POA: Diagnosis present

## 2020-04-27 DIAGNOSIS — Z20822 Contact with and (suspected) exposure to covid-19: Secondary | ICD-10-CM | POA: Diagnosis present

## 2020-04-27 DIAGNOSIS — O289 Unspecified abnormal findings on antenatal screening of mother: Secondary | ICD-10-CM | POA: Diagnosis not present

## 2020-04-27 DIAGNOSIS — O368190 Decreased fetal movements, unspecified trimester, not applicable or unspecified: Secondary | ICD-10-CM | POA: Diagnosis present

## 2020-04-27 HISTORY — DX: Thyrotoxicosis, unspecified without thyrotoxic crisis or storm: E05.90

## 2020-04-27 LAB — URINALYSIS, ROUTINE W REFLEX MICROSCOPIC
Bilirubin Urine: NEGATIVE
Glucose, UA: NEGATIVE mg/dL
Ketones, ur: NEGATIVE mg/dL
Leukocytes,Ua: NEGATIVE
Nitrite: NEGATIVE
Protein, ur: NEGATIVE mg/dL
Specific Gravity, Urine: 1.001 — ABNORMAL LOW (ref 1.005–1.030)
pH: 7 (ref 5.0–8.0)

## 2020-04-27 LAB — TYPE AND SCREEN
ABO/RH(D): O POS
Antibody Screen: NEGATIVE

## 2020-04-27 LAB — CBC
HCT: 36 % (ref 36.0–46.0)
Hemoglobin: 11.5 g/dL — ABNORMAL LOW (ref 12.0–15.0)
MCH: 28.8 pg (ref 26.0–34.0)
MCHC: 31.9 g/dL (ref 30.0–36.0)
MCV: 90 fL (ref 80.0–100.0)
Platelets: 213 10*3/uL (ref 150–400)
RBC: 4 MIL/uL (ref 3.87–5.11)
RDW: 12.9 % (ref 11.5–15.5)
WBC: 7.4 10*3/uL (ref 4.0–10.5)
nRBC: 0 % (ref 0.0–0.2)

## 2020-04-27 LAB — GROUP B STREP BY PCR: Group B strep by PCR: NEGATIVE

## 2020-04-27 LAB — ABO/RH: ABO/RH(D): O POS

## 2020-04-27 LAB — SARS CORONAVIRUS 2 BY RT PCR (HOSPITAL ORDER, PERFORMED IN ~~LOC~~ HOSPITAL LAB): SARS Coronavirus 2: NEGATIVE

## 2020-04-27 MED ORDER — OXYTOCIN 10 UNIT/ML IJ SOLN: 10.0000 [IU] | Freq: Once | INTRAMUSCULAR | Status: DC

## 2020-04-27 MED ORDER — LIDOCAINE HCL (PF) 1 % IJ SOLN: 30.0000 mL | INTRAMUSCULAR | Status: DC | PRN

## 2020-04-27 MED ORDER — NIFEDIPINE 10 MG PO CAPS
10.0000 mg | ORAL_CAPSULE | Freq: Four times a day (QID) | ORAL | Status: DC
  Administered 2020-04-27 – 2020-04-29 (×7): 10 mg via ORAL
  Filled 2020-04-27 (×7): qty 1

## 2020-04-27 MED ORDER — OXYTOCIN-SODIUM CHLORIDE 30-0.9 UT/500ML-% IV SOLN: 2.5000 [IU]/h | INTRAVENOUS | Status: DC

## 2020-04-27 MED ORDER — PENICILLIN G POT IN DEXTROSE 60000 UNIT/ML IV SOLN
3.0000 10*6.[IU] | INTRAVENOUS | Status: DC
  Filled 2020-04-27 (×3): qty 50

## 2020-04-27 MED ORDER — LACTATED RINGERS IV BOLUS
1000.0000 mL | Freq: Once | INTRAVENOUS | Status: AC
  Administered 2020-04-27: 1000 mL via INTRAVENOUS

## 2020-04-27 MED ORDER — SODIUM CHLORIDE 0.9 % IV SOLN
5.0000 10*6.[IU] | Freq: Once | INTRAVENOUS | Status: AC
  Administered 2020-04-27: 5 10*6.[IU] via INTRAVENOUS
  Filled 2020-04-27: qty 5

## 2020-04-27 MED ORDER — NALBUPHINE HCL 10 MG/ML IJ SOLN: 5.0000 mg | INTRAMUSCULAR | Status: DC | PRN

## 2020-04-27 MED ORDER — OXYTOCIN BOLUS FROM INFUSION: 333.0000 mL | Freq: Once | INTRAVENOUS | Status: DC

## 2020-04-27 MED ORDER — NIFEDIPINE 10 MG PO CAPS
30.0000 mg | ORAL_CAPSULE | Freq: Once | ORAL | Status: AC
  Administered 2020-04-27: 30 mg via ORAL
  Filled 2020-04-27: qty 3

## 2020-04-27 MED ORDER — LACTATED RINGERS IV SOLN: 500.0000 mL | INTRAVENOUS | Status: DC | PRN

## 2020-04-27 MED ORDER — BETAMETHASONE SOD PHOS & ACET 6 (3-3) MG/ML IJ SUSP
12.0000 mg | Freq: Two times a day (BID) | INTRAMUSCULAR | Status: AC
  Administered 2020-04-27 – 2020-04-28 (×2): 12 mg via INTRAMUSCULAR
  Filled 2020-04-27: qty 5

## 2020-04-27 MED ORDER — SOD CITRATE-CITRIC ACID 500-334 MG/5ML PO SOLN
30.0000 mL | ORAL | Status: DC | PRN
  Administered 2020-04-27 – 2020-04-28 (×3): 30 mL via ORAL
  Filled 2020-04-27 (×3): qty 30

## 2020-04-27 MED ORDER — ONDANSETRON HCL 4 MG/2ML IJ SOLN
4.0000 mg | Freq: Four times a day (QID) | INTRAMUSCULAR | Status: DC | PRN
  Administered 2020-04-29: 4 mg via INTRAVENOUS
  Filled 2020-04-27: qty 2

## 2020-04-27 MED ORDER — ACETAMINOPHEN 325 MG PO TABS: 650.0000 mg | ORAL_TABLET | ORAL | Status: DC | PRN

## 2020-04-27 MED ORDER — FAMOTIDINE IN NACL 20-0.9 MG/50ML-% IV SOLN
20.0000 mg | Freq: Once | INTRAVENOUS | Status: AC
  Administered 2020-04-27: 20 mg via INTRAVENOUS
  Filled 2020-04-27: qty 50

## 2020-04-27 MED ORDER — LACTATED RINGERS IV SOLN: INTRAVENOUS | Status: DC

## 2020-04-27 NOTE — MAU Note (Addendum)
Started contracting 2 hrs ago. Feels pressure. Denies problems w/preg.  Denies bleeding or leaking.  Has not been feeling much movement

## 2020-04-27 NOTE — Consult Note (Signed)
[facility]  Prenatal Consult       04/27/2020  6:48 PM   I was asked by Dr. Su Hilt to consult on this patient for possible preterm delivery.  I had the pleasure of meeting with Katie Pratt today.  She is a 24 y/o G1P0 at 2 and 0/[redacted] weeks gestation who was admitted in preterm labor.  She has been given tocolysis and the first dose of BMZ.  GBS is pending and she is receiving penicillin prophylasix.  Her pregnancy has been complicated by mild hyperthyroidism but she is not on medication for this.  We discussed common problems associated with late preterm delivery including respiratory distress syndrome, feeding issues, temperature regulation, and infection risk. We discussed the average length of stay but I noted that the actual LOS would depend on the severity of problems encountered and response to treatments.  We discussed visitation policies and the resources available while her child is in the hospital.  Thank you for involving [place] in the care of this patient. A member of our team will be available should the family have additional questions.  Time for consultation approximately 20 minutes.   _____________________ Electronically Signed By: Maryan Char, MD Neonatologist

## 2020-04-27 NOTE — H&P (Signed)
OB ADMISSION/ HISTORY & PHYSICAL:  Admission Date: 04/27/2020  4:09 PM  Admit Diagnosis: CTX 2 min    Katie Pratt is a 24 y.o. female presenting for painful contractions started around 2 pm today. Notes no fetal movement since this morning, tried eating an hour ago and no movement felt. Denies LOF or VB. Mother at [facility] and supportive, FOB involved.  Prenatal History: G1P0   EDC : 06/01/2020, by Last Menstrual Period  Prenatal care at [facility] since 10 wks gest Primary Dr. Mora Appl   Prenatal course complicated by: 1. Mild hyperthyroidism, followed by endo / Dr. Everardo All, no meds needed 2. Heart palpitations w/ normal cardio W/U 3. Heartburn  Prenatal Labs: ABO, Rh:  O pos  Antibody:  neg Rubella:   immune RPR: Non Reactive (10/09 1142)  HBsAg:   neg HIV: Non Reactive (10/09 1142)  GBS:   pending 1 hr Glucola : wnl Genetic Screening: normal NIPS Ultrasound: normal anatomy, anterior placenta 28 wks growth - 1086   gm     2 lb 6 oz     30  % Neg GC/CT    Maternal Diabetes: No Genetic Screening: Normal Maternal Ultrasounds/Referrals: Normal Fetal Ultrasounds or other Referrals:  Referred to Materal Fetal Medicine  Maternal Substance Abuse:  No Significant Maternal Medications:  None Significant Maternal Lab Results:  None Other Comments:  mild hyperthyroid stable off meds  Medical / Surgical History :  Past medical history:  Past Medical History:  Diagnosis Date   Anemia    Eczema    Hyperthyroidism    per prenatal     Past surgical history:  Past Surgical History:  Procedure Laterality Date   NO PAST SURGERIES       Family History:  Family History  Problem Relation Age of Onset   Asthma Mother    Diabetes Maternal Grandmother    Heart failure Maternal Grandmother    Obesity Maternal Grandmother    Arthritis Maternal Grandmother    Thyroid disease Neg Hx      Social History:  reports that she has never smoked. She has never used  smokeless tobacco. She reports that she does not drink alcohol and does not use drugs.   Allergies: Patient has no known allergies.   Current Medications at time of admission:  Medications Prior to Admission  Medication Sig Dispense Refill Last Dose   calcium carbonate (TUMS - DOSED IN MG ELEMENTAL CALCIUM) 500 MG chewable tablet Chew 1 tablet by mouth daily.   Past Month at Unknown time   famotidine (PEPCID) 20 MG tablet Take 1 tablet (20 mg total) by mouth 2 (two) times daily. 30 tablet 0 04/26/2020 at Unknown time   Prenatal Vit-Fe Fumarate-FA (MULTIVITAMIN-PRENATAL) 27-0.8 MG TABS tablet Take 1 tablet by mouth daily at 12 noon.   04/27/2020 at Unknown time   acetaminophen (TYLENOL) 325 MG tablet Take 2 tablets (650 mg total) by mouth every 6 (six) hours as needed. 30 tablet 0 More than a month at Unknown time     Review of Systems: ROS Painful ctx, no LOF/VB, minimal FM.  + back pain  Physical Exam: Vital signs and nursing notes reviewed.  Patient Vitals for the past 24 hrs:  BP Temp Temp src Pulse Resp SpO2  04/27/20 1628 118/83 99 F (37.2 C) Axillary (!) 125 (!) 22 100 %     General: AAO x 3, NAD, coping well Heart: RRR Lungs:CTAB Abdomen: Gravid, NT Extremities: no edema Genitalia / VE:  Dilation: 3.5 Effacement (%): 80 Cervical Position: Posterior Station: -3 Presentation: Vertex (per bedside U/S) Exam by:: D Katie Pratt CNM  BBOW  BS sono - vertex, + FHT, fetal spine to maternal L, anterior placenta, AFI subjectively WNL  FHR: 140 BPM, moderate variability, small accels, no decels TOCO: Ctx q 2 min, palp moderate  Labs:   Pending T&S, CBC, RPR, GBS, SarS-Covid-19, UA    Assessment/Plan:  24 y.o. G1P0 at [redacted]w[redacted]d Threatened preterm labor Mild hyperthyroidism stable off meds FHT category 1 GBS unknown  Admit to L&D, routine orders Procardia protocol for tocolysis Betamethasone 12.5 mg q 12H x 2 doses for fetal lung maturation IV LR bolus 1L now then 125  cc/hr UA, GBS pending PCN prophylaxis for unknown GBS status pending result Analgesia/anesthesia PRN NICU consult for prematurity  Dr Su Hilt notified of admission / plan of care   Neta Mends CNM, MSN 04/27/2020, 5:01 PM

## 2020-04-28 ENCOUNTER — Inpatient Hospital Stay (HOSPITAL_BASED_OUTPATIENT_CLINIC_OR_DEPARTMENT_OTHER): Payer: BC Managed Care – PPO

## 2020-04-28 DIAGNOSIS — O289 Unspecified abnormal findings on antenatal screening of mother: Secondary | ICD-10-CM

## 2020-04-28 DIAGNOSIS — O283 Abnormal ultrasonic finding on antenatal screening of mother: Secondary | ICD-10-CM

## 2020-04-28 DIAGNOSIS — Z3A35 35 weeks gestation of pregnancy: Secondary | ICD-10-CM | POA: Diagnosis not present

## 2020-04-28 LAB — RPR: RPR Ser Ql: NONREACTIVE

## 2020-04-28 MED ORDER — PRENATAL MULTIVITAMIN CH
1.0000 | ORAL_TABLET | Freq: Every day | ORAL | Status: DC
  Administered 2020-04-28 – 2020-04-29 (×2): 1 via ORAL
  Filled 2020-04-28 (×2): qty 1

## 2020-04-28 MED ORDER — FAMOTIDINE 20 MG PO TABS
20.0000 mg | ORAL_TABLET | Freq: Two times a day (BID) | ORAL | Status: DC
  Administered 2020-04-28 – 2020-04-29 (×3): 20 mg via ORAL
  Filled 2020-04-28 (×3): qty 1

## 2020-04-28 MED ORDER — ZOLPIDEM TARTRATE 5 MG PO TABS: 5.0000 mg | ORAL_TABLET | Freq: Every evening | ORAL | Status: DC | PRN

## 2020-04-28 MED ORDER — CALCIUM CARBONATE ANTACID 500 MG PO CHEW: 2.0000 | CHEWABLE_TABLET | ORAL | Status: DC | PRN

## 2020-04-28 MED ORDER — DOCUSATE SODIUM 100 MG PO CAPS
100.0000 mg | ORAL_CAPSULE | Freq: Every day | ORAL | Status: DC
  Administered 2020-04-28 – 2020-04-29 (×2): 100 mg via ORAL
  Filled 2020-04-28 (×2): qty 1

## 2020-04-28 NOTE — Progress Notes (Signed)
10:05 - Spoke to Dr. Sallye Ober Colon Flattery, CNM in delivery and did not answer) regarding 6 minute prolonged decel. FHR back to baseline and with moderate variability.  Asked about timing of MFM ultrasound.  Dr. Sallye Ober said to get 20 minutes of a Category 1 strip and then ask ultrasound to complete study at bedside.  11:15 - Spoke to D. Renae Fickle, CNM at request of ultrasonographer regarding whether to complete a BPP with growth ultrasound.  CNM asked that BPP be done in light of earlier prolonged decel.  13:38 - Spoke to D. Renae Fickle, CNM when pt complained of increasing vaginal pressure. Pt reports back-to-back contractions and acts uncomfortable during them.  RN able to palpate contractions, but not picking up on monitor.  Toco adjusted.  CNM to come to room for cervical exam.

## 2020-04-28 NOTE — Progress Notes (Addendum)
S: Patient stable in antepartum room, transferred this AM after spending night in L&D.  Reports ctx mild occasionally, feeling much better. No LOF or VB.  Partner at [facility] and supportive.   O: BP 115/82 (BP Location: Right Arm)   Pulse 100   Temp 98 F (36.7 C) (Oral)   Resp 18   Ht 4\' 11"  (1.499 m)   Wt 57.2 kg   LMP 08/26/2019   SpO2 100%   BMI 25.47 kg/m    14/11/2018: 135 bpm, moderate variability, one prolonged decel to 100 bpm x 5 min, no repetitive Toco: rare SVE: deferred  A/P- 24 y.o. admitted with preterm labor   Preterm labor management: Procardia 30 mg loading dose then 10 mg q 6 hrs Dating:  [redacted]w[redacted]d BMZ inj x 2 completed 5 am today FWB:  Category 1 overall GBS negative  Growth sono completed and results pending BPP 8/8 per sono tech  Plan tocolysis until 48 hrs steroids completed tomorrow 5 pm, then expectant management.  NICU consult completed.   ROD: spontaneous vaginal    [redacted]w[redacted]d, CNM, MSN 04/28/2020, 12:09 PM   MD Addendum: I reviewed chart and agree with above findings, assessment and plan as outlined above by 06/28/2020, CNM, MSN.  Category 1 tracing noted at 1530.    Dr. Neta Mends. 04/28/2020. 1705.

## 2020-04-29 LAB — CBC
HCT: 31.4 % — ABNORMAL LOW (ref 36.0–46.0)
Hemoglobin: 9.9 g/dL — ABNORMAL LOW (ref 12.0–15.0)
MCH: 28.4 pg (ref 26.0–34.0)
MCHC: 31.5 g/dL (ref 30.0–36.0)
MCV: 90 fL (ref 80.0–100.0)
Platelets: 218 10*3/uL (ref 150–400)
RBC: 3.49 MIL/uL — ABNORMAL LOW (ref 3.87–5.11)
RDW: 13.1 % (ref 11.5–15.5)
WBC: 13.5 10*3/uL — ABNORMAL HIGH (ref 4.0–10.5)
nRBC: 0.3 % — ABNORMAL HIGH (ref 0.0–0.2)

## 2020-04-29 MED ORDER — ACETAMINOPHEN 500 MG PO TABS
1000.0000 mg | ORAL_TABLET | Freq: Once | ORAL | Status: AC
  Administered 2020-04-29: 1000 mg via ORAL
  Filled 2020-04-29: qty 2

## 2020-04-29 MED ORDER — ACETAMINOPHEN 500 MG PO TABS: 1000.0000 mg | ORAL_TABLET | Freq: Four times a day (QID) | ORAL | Status: DC | PRN

## 2020-04-29 MED ORDER — OXYTOCIN BOLUS FROM INFUSION
333.0000 mL | Freq: Once | INTRAVENOUS | Status: AC
  Administered 2020-04-30: 333 mL via INTRAVENOUS

## 2020-04-29 MED ORDER — ONDANSETRON HCL 4 MG/2ML IJ SOLN: 4.0000 mg | Freq: Four times a day (QID) | INTRAMUSCULAR | Status: DC | PRN

## 2020-04-29 MED ORDER — OXYTOCIN-SODIUM CHLORIDE 30-0.9 UT/500ML-% IV SOLN
2.5000 [IU]/h | INTRAVENOUS | Status: DC
  Filled 2020-04-29: qty 500

## 2020-04-29 MED ORDER — SODIUM CHLORIDE 0.9% FLUSH: 3.0000 mL | INTRAVENOUS | Status: DC | PRN

## 2020-04-29 MED ORDER — SOD CITRATE-CITRIC ACID 500-334 MG/5ML PO SOLN
30.0000 mL | ORAL | Status: DC | PRN
  Administered 2020-04-29: 30 mL via ORAL
  Filled 2020-04-29: qty 30

## 2020-04-29 MED ORDER — FENTANYL CITRATE (PF) 100 MCG/2ML IJ SOLN
50.0000 ug | INTRAMUSCULAR | Status: DC | PRN
  Administered 2020-04-29: 50 ug via INTRAVENOUS
  Filled 2020-04-29: qty 2

## 2020-04-29 MED ORDER — OXYTOCIN 10 UNIT/ML IJ SOLN: 10.0000 [IU] | Freq: Once | INTRAMUSCULAR | Status: DC

## 2020-04-29 MED ORDER — LACTATED RINGERS IV SOLN: 500.0000 mL | INTRAVENOUS | Status: DC | PRN

## 2020-04-29 MED ORDER — SODIUM CHLORIDE 0.9% FLUSH: 3.0000 mL | Freq: Two times a day (BID) | INTRAVENOUS | Status: DC

## 2020-04-29 MED ORDER — SODIUM CHLORIDE 0.9 % IV SOLN: 250.0000 mL | INTRAVENOUS | Status: DC | PRN

## 2020-04-29 MED ORDER — LIDOCAINE HCL (PF) 1 % IJ SOLN
30.0000 mL | INTRAMUSCULAR | Status: AC | PRN
  Administered 2020-04-30: 30 mL via SUBCUTANEOUS
  Filled 2020-04-29: qty 30

## 2020-04-29 MED ORDER — LACTATED RINGERS IV SOLN: INTRAVENOUS | Status: DC

## 2020-04-29 NOTE — Progress Notes (Signed)
Pt transferred to room 215 with all belongings. Pt accompanied by support person and visitor.

## 2020-04-29 NOTE — Progress Notes (Addendum)
S: Feeling more back pain and increased contractions. Desires to have SVE.  Partner at [facility] and supportive.   O: BP (!) 110/58 (BP Location: Right Arm)   Pulse (!) 110   Temp 97.7 F (36.5 C) (Oral)   Resp 15   Ht 4\' 11"  (1.499 m)   Wt 56.9 kg   LMP 08/26/2019   SpO2 100%   BMI 25.35 kg/m  14/11/2018: 135 bpm, moderate variability, + accels, no decels Toco: occasional SVE: 3-4/80/-1  A/P- 24 y.o. admitted with preterm labor   Preterm labor management: Consult with Dr. 25. Will discontinue Procardia and expectantly manage on OB Speciality. If contractions break through, will transfer to L&D Dating:  [redacted]w[redacted]d BMZ inj x 2 completed 5 am today FWB:  Category 1 overall GBS negative  Growth sono 04/28/20 - vertex, AFI 15.3, 2452gms, 5lb 6oz, 27%, BPP 8/8  Discontinue Procardia and expectantly manage. Will transfer to L&D in active labor.  NICU consult completed.   ROD: NSVD  Dr. 06/28/20 consulted regarding plan of care. Agrees with plan.   Richardson Dopp, CNM, MSN 04/29/2020, 12:52 PM

## 2020-04-29 NOTE — Progress Notes (Signed)
S: Breathing through contractions on the birthing ball. States pain is much more intense and in her back. Father of the baby present and supportive.   O: Vitals:   04/29/20 1658 04/29/20 1923 04/29/20 2019 04/29/20 2205  BP: 122/70 118/73 (!) 115/59 126/67  Pulse: 99 90 (!) 105 79  Resp:  16 17 18   Temp: 98.6 F (37 C) 97.8 F (36.6 C)  98.2 F (36.8 C)  TempSrc: Oral Oral  Oral  SpO2:      Weight:      Height:       FHT:  FHR: 130 bpm, variability: moderate,  accelerations:  Present,  decelerations:  Absent UC:   irregular, every 2-8 minutes SVE:   Dilation: 6 Effacement (%): 70 Station: -1, 0 Exam by:: [email or web address], CNM AROM of clear fluid @ 2202  A / P: Preterm labor, progressing after discontinung tocolysis, s/p betamethasone series  Fetal Wellbeing:  Category I Pain Control:  IV pain meds Anticipated MOD:  NSVD   Pt may have epidural if desired.  Dr. 2203 aware of plan of care.   Richardson Dopp, CNM, MSN 04/29/2020, 10:21 PM

## 2020-04-29 NOTE — Progress Notes (Signed)
S: Feeling rectal pressure and increased contractions. Desires pain medication. FOB present and supportive.   O: BP 123/72 (BP Location: Right Arm)   Pulse 100   Temp 97.9 F (36.6 C) (Oral)   Resp 15   Ht 4\' 11"  (1.499 m)   Wt 56.9 kg   LMP 08/26/2019   SpO2 100%   BMI 25.35 kg/m  14/11/2018: 120 bpm, moderate variability, + accels, occasional variable decelerations Toco: 2-3 minutes SVE: 4-5/80/-1  A/P- 24 y.o. admitted with preterm labor   Preterm labor  Dating:  [redacted]w[redacted]d BMZ complete FWB:  Category 1 GBS negative  Growth sono 04/28/20 - vertex, AFI 15.3, 2452gms, 5lb 6oz, 27%, BPP 8/8 NICU consult completed.  Transfer to L&D for labor.  ROD: NSVD  Dr. 06/28/20 aware.   Richardson Dopp, CNM, MSN 04/29/2020, 4:21 PM

## 2020-04-29 NOTE — Progress Notes (Signed)
At 0125 [facility] notified of increase in patient discomfort w/ contractions, patient describing increased pressure in the vaginal area, contractions spaced w/ little rest between, contractions lasting mainly 40 seconds with occasional longer ctx noted, uterine activity noted between contractions, and need for frequent TOCO adjustment due to ctx not being captured. Procardia given @0000 . CNM reinforced current pain medication available if pt requests, allow time for Procardia to act as tocolytic, contractions following pt typical pattern, no need for cervical exam unless patient feels the need to push, continue monitoring, no new orders.   Will continue to monitor.

## 2020-04-30 ENCOUNTER — Encounter (HOSPITAL_COMMUNITY): Payer: Self-pay | Admitting: Obstetrics and Gynecology

## 2020-04-30 LAB — CBC
HCT: 29.4 % — ABNORMAL LOW (ref 36.0–46.0)
Hemoglobin: 9.6 g/dL — ABNORMAL LOW (ref 12.0–15.0)
MCH: 29.7 pg (ref 26.0–34.0)
MCHC: 32.7 g/dL (ref 30.0–36.0)
MCV: 91 fL (ref 80.0–100.0)
Platelets: 228 10*3/uL (ref 150–400)
RBC: 3.23 MIL/uL — ABNORMAL LOW (ref 3.87–5.11)
RDW: 13.2 % (ref 11.5–15.5)
WBC: 16.9 10*3/uL — ABNORMAL HIGH (ref 4.0–10.5)
nRBC: 0.4 % — ABNORMAL HIGH (ref 0.0–0.2)

## 2020-04-30 LAB — RPR: RPR Ser Ql: NONREACTIVE

## 2020-04-30 MED ORDER — SENNOSIDES-DOCUSATE SODIUM 8.6-50 MG PO TABS
2.0000 | ORAL_TABLET | ORAL | Status: DC
  Administered 2020-05-01 – 2020-05-02 (×2): 2 via ORAL
  Filled 2020-04-30 (×2): qty 2

## 2020-04-30 MED ORDER — DIBUCAINE (PERIANAL) 1 % EX OINT: 1.0000 "application " | TOPICAL_OINTMENT | CUTANEOUS | Status: DC | PRN

## 2020-04-30 MED ORDER — ZOLPIDEM TARTRATE 5 MG PO TABS: 5.0000 mg | ORAL_TABLET | Freq: Every evening | ORAL | Status: DC | PRN

## 2020-04-30 MED ORDER — ACETAMINOPHEN 325 MG PO TABS
650.0000 mg | ORAL_TABLET | ORAL | Status: DC | PRN
  Administered 2020-04-30 – 2020-05-01 (×4): 650 mg via ORAL
  Filled 2020-04-30 (×4): qty 2

## 2020-04-30 MED ORDER — SIMETHICONE 80 MG PO CHEW: 80.0000 mg | CHEWABLE_TABLET | ORAL | Status: DC | PRN

## 2020-04-30 MED ORDER — PRENATAL MULTIVITAMIN CH
1.0000 | ORAL_TABLET | Freq: Every day | ORAL | Status: DC
  Administered 2020-04-30 – 2020-05-02 (×3): 1 via ORAL
  Filled 2020-04-30 (×3): qty 1

## 2020-04-30 MED ORDER — DIPHENHYDRAMINE HCL 25 MG PO CAPS: 25.0000 mg | ORAL_CAPSULE | Freq: Four times a day (QID) | ORAL | Status: DC | PRN

## 2020-04-30 MED ORDER — ONDANSETRON HCL 4 MG PO TABS: 4.0000 mg | ORAL_TABLET | ORAL | Status: DC | PRN

## 2020-04-30 MED ORDER — IBUPROFEN 600 MG PO TABS
600.0000 mg | ORAL_TABLET | Freq: Four times a day (QID) | ORAL | Status: DC
  Administered 2020-04-30 – 2020-05-02 (×10): 600 mg via ORAL
  Filled 2020-04-30 (×10): qty 1

## 2020-04-30 MED ORDER — TETANUS-DIPHTH-ACELL PERTUSSIS 5-2.5-18.5 LF-MCG/0.5 IM SUSP: 0.5000 mL | Freq: Once | INTRAMUSCULAR | Status: DC

## 2020-04-30 MED ORDER — WITCH HAZEL-GLYCERIN EX PADS: 1.0000 "application " | MEDICATED_PAD | CUTANEOUS | Status: DC | PRN

## 2020-04-30 MED ORDER — ONDANSETRON HCL 4 MG/2ML IJ SOLN: 4.0000 mg | INTRAMUSCULAR | Status: DC | PRN

## 2020-04-30 MED ORDER — BENZOCAINE-MENTHOL 20-0.5 % EX AERO
1.0000 "application " | INHALATION_SPRAY | CUTANEOUS | Status: DC | PRN
  Administered 2020-04-30: 1 via TOPICAL
  Filled 2020-04-30: qty 56

## 2020-04-30 MED ORDER — COCONUT OIL OIL: 1.0000 "application " | TOPICAL_OIL | Status: DC | PRN

## 2020-04-30 NOTE — Lactation Note (Signed)
This note was copied from a baby's chart. Lactation Consultation Note  Patient Name: Katie Pratt [id number] Date: 04/30/2020 Reason for consult: Initial assessment;Primapara;1st time breastfeeding;Late-preterm 34-36.6wks;Infant < 6lbs;Other (Comment) (initially baby had low blood sugars -) Baby is 17 hours old  As LC entered the room baby coming back from the nursery and NT mentioned the baby was fed formula. ( LC noted 30 ml on the doc flow sheet with the Nfant nipple ). Grandmother holding baby and he was asleep.  Mom mentioned she was told she had flat nipple. LC offered to assess tissue and show her how to hand express and LC noted both nipples to be erect and the areolas some swelling but compressible.  LC reassured mom at the next feeding , prior to supplementing , try latching at the breast and LC showed how far over the nipple onto the areola baby's mouth should be for a deep latch. LC recommended calling for the nurse or LC for assistance and feeding assessment.  LC reviewed the DEBP , 1st setting and mom pumping both breast with #24 F with comfort. LC reassured mom she may get colostrum or may not and that is normal. The Stimulation is important to enhance the let down. LC instructed mom to save milk if EBM yield.  LC also reviewed storage and cleaning the pump parts.  Per mom active to [facility] [employer].  LC provided the [facility] pamphlet and the LPT handout with explanation.    Maternal Data Has patient been taught Hand Expression?: Yes Does the patient have breastfeeding experience prior to this delivery?: No  Feeding Feeding Type:  (baby had recently fed formula) Nipple Type: Nfant Slow Flow (purple)  LATCH Score                   Interventions Interventions: Breast feeding basics reviewed;Hand pump;DEBP  Lactation Tools Discussed/Used Tools: Pump;Flanges Flange Size: 24 (a good fit) Breast pump type: Double-Electric Breast Pump;Manual WIC  Program: Yes Pump Review: Milk Storage;Setup, frequency, and cleaning Initiated by:: MAI Date initiated:: 04/30/20   Consult Status Consult Status: Follow-up Date: 05/01/20 Follow-up type: In-patient    Katie Pratt 04/30/2020, 7:18 PM

## 2020-05-01 DIAGNOSIS — D509 Iron deficiency anemia, unspecified: Secondary | ICD-10-CM | POA: Diagnosis present

## 2020-05-01 MED ORDER — POLYSACCHARIDE IRON COMPLEX 150 MG PO CAPS
150.0000 mg | ORAL_CAPSULE | Freq: Every day | ORAL | Status: DC
  Administered 2020-05-01 – 2020-05-02 (×2): 150 mg via ORAL
  Filled 2020-05-01 (×2): qty 1

## 2020-05-01 MED ORDER — MAGNESIUM OXIDE 400 (241.3 MG) MG PO TABS
400.0000 mg | ORAL_TABLET | Freq: Every day | ORAL | Status: DC
  Administered 2020-05-01 – 2020-05-02 (×2): 400 mg via ORAL
  Filled 2020-05-01 (×2): qty 1

## 2020-05-01 NOTE — Lactation Note (Signed)
This note was copied from a baby's chart. Lactation Consultation Note  Patient Name: Katie Pratt [id number]'[id number] Date: 05/01/2020 Reason for consult: Follow-up assessment;Primapara;1st time breastfeeding;Late-preterm 34-36.6wks;Infant weight loss;Infant < 6lbs Baby is 2 hours old  Per mom baby was circ'd at 1 pm and recently fed formula.  LC noted 16 ml from the bottle and documented. Mom changing a wet diaper.  Baby awake and LC offered to assist baby to latch. Mom receptive and latched on the right breast , football and baby fed for  7 mins and fell asleep.  Mom milk flows well .  Mom mentioned she has pumped x 3 in the last 24 hours with no milk.  LC reassured mom that is normal, and it can be a slow process.  LC encouraged mom to pump with DEBP and hand express.  Per mom  WIC called her and she can pick up her DEBP Wednesday at 3 pm.  Mom aware she can call for assistance.   Maternal Data    Feeding Feeding Type: Breast Fed Nipple Type: Extra Slow Flow  LATCH Score ( Latch Score by LC.  Latch: Grasps breast easily, tongue down, lips flanged, rhythmical sucking.  Audible Swallowing: Spontaneous and intermittent  Type of Nipple: Everted at rest and after stimulation  Comfort (Breast/Nipple): Soft / non-tender  Hold (Positioning): Assistance needed to correctly position infant at breast and maintain latch.  LATCH Score: 9  Interventions Interventions: Breast feeding basics reviewed;Assisted with latch;Skin to skin;Breast massage;Hand express;Breast compression;Adjust position;Support pillows;Position options  Lactation Tools Discussed/Used WIC Program:  (per mom WIC - called picking up a DEBP Wednesday at 3 pm) Pump Review: Milk Storage   Consult Status Consult Status: Follow-up Date: 05/02/20 Follow-up type: In-patient    [name] Heaven Meeker 05/01/2020, 4:31 PM

## 2020-05-01 NOTE — Progress Notes (Addendum)
PPD# 1 SVD w/ 1st degree perineal laceration Information for the patient's newborn:  Verneda, Hollopeter [[id number]]  female    Baby Name Gaynelle Cage Circumcision desires in-pt   S:   Reports feeling good Tolerating PO fluid and solids No nausea or vomiting Bleeding is light, no clots Pain controlled with PO meds Up ad lib / ambulatory / voiding w/o difficulty Feeding: Breast    O:   VS: BP 115/68   Pulse 62   Temp 98.6 F (37 C) (Oral)   Resp 18   Ht 4\' 11"  (1.499 m)   Wt 56.9 kg   LMP 08/26/2019   SpO2 100%   Breastfeeding Unknown   BMI 25.35 kg/m   LABS:  Recent Labs    04/29/20 1738 04/30/20 0943  WBC 13.5* 16.9*  HGB 9.9* 9.6*  PLT 218 228   Blood type: --/--/O POS Performed at [facility], [address], [place], [place]  [phone]) Rubella:                        I&O: Intake/Output      08/08 0701 - 08/09 0700 08/09 0701 - 08/10 0700   P.O.     I.V. (mL/kg)     Total Intake(mL/kg)     Urine (mL/kg/hr)     Emesis/NG output     Blood     Total Output     Net            Physical Exam: Alert and oriented X3 Lungs: Clear and unlabored Heart: regular rate and rhythm / no mumurs Abdomen: soft, non-tender, non-distended  Fundus: firm, non-tender Perineum: non-edematous Lochia: appropriate Extremities: no edema, no calf pain or tenderness    A:  PPD # 1  Normal exam S/P preterm labor and birth @ 82+3     -baby boy rooming in IDA  P:  Routine post partum orders Lactation support Start oral Fe Anticipate D/C on 05/02/20   Plan reviewed w/ Dr. 07/02/20, MSN, CNM 05/01/2020, 8:43 AM

## 2020-05-02 LAB — SURGICAL PATHOLOGY

## 2020-05-02 MED ORDER — MAGNESIUM OXIDE 400 (241.3 MG) MG PO TABS: 400.0000 mg | ORAL_TABLET | Freq: Every day | ORAL | 2 refills | Status: AC

## 2020-05-02 MED ORDER — ACETAMINOPHEN 325 MG PO TABS: 650.0000 mg | ORAL_TABLET | ORAL | 1 refills | Status: AC | PRN

## 2020-05-02 MED ORDER — IBUPROFEN 600 MG PO TABS: 600.0000 mg | ORAL_TABLET | Freq: Four times a day (QID) | ORAL | 0 refills | Status: AC

## 2020-05-02 MED ORDER — COCONUT OIL OIL: 1.0000 "application " | TOPICAL_OIL | 0 refills | Status: AC | PRN

## 2020-05-02 MED ORDER — BENZOCAINE-MENTHOL 20-0.5 % EX AERO: 1.0000 "application " | INHALATION_SPRAY | CUTANEOUS | 1 refills | Status: AC | PRN

## 2020-05-02 MED ORDER — POLYSACCHARIDE IRON COMPLEX 150 MG PO CAPS: 150.0000 mg | ORAL_CAPSULE | Freq: Every day | ORAL | 2 refills | Status: AC

## 2020-05-02 NOTE — Lactation Note (Signed)
This note was copied from a baby's chart. Lactation Consultation Note  Patient Name: Katie Pratt Date: 05/02/2020 Reason for consult: Follow-up assessment;Late-preterm 34-36.6wks;Infant < 6lbs   Mother is a P70, infant is 28 hours old . and is now at 6  % wt loss.   Mother reports that infant is breast feeding well and is taking up to 69ml  from the bottle. Mother reports that [facility] request she bring all parts of the pump when she leaves the hospital. LC gathered all parts of the pump for her and packed with belonging.   Mother also said that wic required a rx for formula from the MD.  Explained to mother that she will have to ask MD when she sees the babies Dr.  Ezequiel Ganser  Assist with the harmony hand pump with instructions. Mothers nipples are erect with compressible breast tissue. No observed trama of mothers nipples.   Mother reports that infant just had a feeding and will eat again at 10:30. Mother to page for [facility] to assist and observed feeding.   Discussed treatment and prevention of engorgement.   Plan of Care : Breastfeed infant with feeding cues Supplement infant with ebm/formula, according to supplemental guidelines. Pump using a DEBP after each feeding for 15-20 mins.   Mother to continue to cue base feed infant and feed at least 8-12 times or more in 24 hours and advised to allow for cluster feeding infant as needed.   Mother to continue to due STS. Mother is aware of available LC services at [facility], BFSG'S, OP Dept, and phone # for questions or concerns about breastfeeding.  Mother receptive to all teaching and plan of care.    Maternal Data    Feeding    LATCH Score                   Interventions    Lactation Tools Discussed/Used     Consult Status Consult Status: Follow-up (mother to page to check latch) Date: 05/02/20 Follow-up type: In-patient    Stevan Born [facility] 05/02/2020, 9:26 AM

## 2020-05-02 NOTE — Discharge Summary (Signed)
OB Discharge Summary  Patient Name: Katie Pratt DOB: Feb 07, 1996 MRN: [id number]  Date of admission: 04/27/2020 Delivering provider: Dorisann Frames K   Admitting diagnosis: Preterm contractions [O47.9] Intrauterine pregnancy: [redacted]w[redacted]d     Secondary diagnosis: Patient Active Problem List   Diagnosis Date Noted  . IDA (iron deficiency anemia) 05/01/2020  . SVD (spontaneous vaginal delivery) 8/8 04/30/2020  . Postpartum care following vaginal delivery 8/8 04/30/2020  . Palpitations 03/31/2020  . Hyperthyroidism 01/17/2020   Additional problems: Preterm delivery ([redacted]w[redacted]d)  Date of discharge: 05/02/2020   Discharge diagnosis: Principal Problem:   Postpartum care following vaginal delivery 8/8 Active Problems:   SVD (spontaneous vaginal delivery) 8/8   IDA (iron deficiency anemia)                                                       Post partum procedures:None  Augmentation: AROM Pain control: Local  Laceration:1st degree  Episiotomy:None  Complications: None  Hospital course:  Onset of Labor With Vaginal Delivery      24 y.o. yo G1P0101 at [redacted]w[redacted]d was admitted in Latent Labor on 04/27/2020. Patient was given betamethasone series and Procardia for 48 hours with cessation of contractions. The Procardia was discontinued after 48 hours and the patient entered active labor. Patient had an uncomplicated labor course as follows:  Membrane Rupture Time/Date: 10:02 PM ,04/29/2020   Delivery Method:Vaginal, Spontaneous  Episiotomy: None  Lacerations:  1st degree  Patient had an uncomplicated postpartum course.  She is ambulating, tolerating a regular diet, passing flatus, and urinating well. Patient is anemic with a hemoglobin of 9.6 and was started on PO Niferex and mag oxide. She is asymptomatic. Patient is discharged home in stable condition on 05/02/20.  Newborn Data: Birth date:04/30/2020  Birth time:1:27 AM  Gender:Female  Living status:Living  Apgars:8 ,9  Weight:2600 g   Physical exam   Vitals:   05/01/20 0830 05/01/20 1404 05/01/20 2031 05/02/20 0534  BP: 115/68 112/76 123/73 109/60  Pulse: 62 75 85 67  Resp: 18 18 16 18   Temp: 98.6 F (37 C)  98.8 F (37.1 C) 98.4 F (36.9 C)  TempSrc: Oral  Oral Oral  SpO2:   100% 99%  Weight:      Height:       General: alert, cooperative and no distress Lochia: appropriate Uterine Fundus: firm Perineum: repair intact, no edema DVT Evaluation: No evidence of DVT seen on physical exam. Labs: Lab Results  Component Value Date   WBC 16.9 (H) 04/30/2020   HGB 9.6 (L) 04/30/2020   HCT 29.4 (L) 04/30/2020   MCV 91.0 04/30/2020   PLT 228 04/30/2020   CMP Latest Ref Rng & Units 03/30/2020  Glucose 70 - 99 mg/dL 99  BUN 6 - 20 mg/dL 05/31/2020)  Creatinine <7(E - 1.00 mg/dL 7.20  Sodium 9.47 - 096 mmol/L 133(L)  Potassium 3.5 - 5.1 mmol/L 3.2(L)  Chloride 98 - 111 mmol/L 102  CO2 22 - 32 mmol/L 20(L)  Calcium 8.9 - 10.3 mg/dL 283)  Total Protein 6.5 - 8.1 g/dL 6.3(L)  Total Bilirubin 0.3 - 1.2 mg/dL 0.7  Alkaline Phos 38 - 126 U/L 177(H)  AST 15 - 41 U/L 49(H)  ALT 0 - 44 U/L 39   Edinburgh Postnatal Depression Scale Screening Tool 04/30/2020 04/30/2020  I have been able to laugh  and see the funny side of things. 0 (No Data)  I have looked forward with enjoyment to things. 0 -  I have blamed myself unnecessarily when things went wrong. 1 -  I have been anxious or worried for no good reason. 1 -  I have felt scared or panicky for no good reason. 0 -  Things have been getting on top of me. 1 -  I have been so unhappy that I have had difficulty sleeping. 1 -  I have felt sad or miserable. 1 -  I have been so unhappy that I have been crying. 0 -  The thought of harming myself has occurred to me. 0 -  Edinburgh Postnatal Depression Scale Total 5 -   Discharge instruction:  per After Visit Summary  After Visit Meds:  Allergies as of 05/02/2020   No Known Allergies     Medication List    STOP taking these medications    famotidine 20 MG tablet Commonly known as: PEPCID     TAKE these medications   acetaminophen 325 MG tablet Commonly known as: Tylenol Take 2 tablets (650 mg total) by mouth every 4 (four) hours as needed (for pain scale < 4). What changed:   when to take this  reasons to take this   benzocaine-Menthol 20-0.5 % Aero Commonly known as: DERMOPLAST Apply 1 application topically as needed for irritation (perineal discomfort).   coconut oil Oil Apply 1 application topically as needed.   ibuprofen 600 MG tablet Commonly known as: ADVIL Take 1 tablet (600 mg total) by mouth every 6 (six) hours.   iron polysaccharides 150 MG capsule Commonly known as: NIFEREX Take 1 capsule (150 mg total) by mouth daily.   magnesium oxide 400 (241.3 Mg) MG tablet Commonly known as: MAG-OX Take 1 tablet (400 mg total) by mouth daily.   multivitamin-prenatal 27-0.8 MG Tabs tablet Take 1 tablet by mouth daily at 12 noon.            Discharge Care Instructions  (From admission, onward)         Start     Ordered   05/02/20 0000  Discharge wound care:       Comments: Sitz baths 2 times /day with warm water x 1 week. May add herbals: 1 ounce dried comfrey leaf* 1 ounce calendula flowers 1 ounce lavender flowers  Supplies can be found online at [employer] sources at [employer], [employer]  1/2 ounce dried uva ursi leaves 1/2 ounce witch hazel blossoms (if you can find them) 1/2 ounce dried sage leaf 1/2 cup sea salt Directions: Bring 2 quarts of water to a boil. Turn off heat, and place 1 ounce (approximately 1 large handful) of the above mixed herbs (not the salt) into the pot. Steep, covered, for 30 minutes.  Strain the liquid well with a fine mesh strainer, and discard the herb material. Add 2 quarts of liquid to the tub, along with the 1/2 cup of salt. This medicinal liquid can also be made into compresses and peri-rinses.   05/02/20 0925         Diet: routine  diet  Activity: Advance as tolerated. Pelvic rest for 6 weeks.   Postpartum contraception: IUD Mirena  Newborn Data: Live born female  Birth Weight: 5 lb 11.7 oz (2600 g) APGAR: 8, 9  Newborn Delivery   Birth date/time: 04/30/2020 01:27:00 Delivery type: Vaginal, Spontaneous     Named [place]  Baby Feeding: Bottle  and Breast Disposition:home with mother  Delivery Report:   Review the Delivery Report for details.    Follow up:  Follow-up Information    [facility]. Schedule an appointment as soon as possible for a visit in 6 week(s).   Specialty: Obstetrics and Gynecology Why: Please make an appointment for 6 weeks postpartum.  Contact information: [address] [place] [address] [phone]             Signed: Clancy Gourd, MSN 05/02/2020, 9:26 AM

## 2020-05-03 ENCOUNTER — Ambulatory Visit: Payer: Self-pay

## 2020-05-03 NOTE — Lactation Note (Signed)
This note was copied from a baby's chart. Lactation Consultation Note  Patient Name: Katie Pratt OZDGU'Y Date: 05/03/2020    Mom has not pumped since yesterday b/c her items had been packed b/c she had thought she was going home.  A hand pump was provided (size 24 flanges are appropriate at this time) and hand expression was taught to Mom. Hand expression proved to be very effective, as she was able to hand express more than 1.5 oz! Mom's breasts palpate as if she will have more milk coming to volume.   Mom says she has been feeding the infant at breast for 10-15 minutes (Mom says infant does not feed continuously), burps baby, & then feeds with a bottle (which takes about 10 minutes). Mom has been using slow-flow nipples, but says they seem too fast. Mom remarked that the extra-slow flow nipples she had used earlier were better. I provided Mom with extra-slow flow nipples. Since infant is 35 weeks, I spoke w/Lauren Steffanie Rainwater, NP & Jeb Levering, SLP. SLP will come for the next bottle feeding. Mom & RN were made aware.   I educated Mom that infant may not be effective at breastfeeding until he gets closer to his EDD. I mentioned that Aldine Contes, IBCLC works at the pediatrician's office & Mom could ask to see her. I also sent Chales Abrahams a note through Epic making her aware.   Mom has an appt today with [facility] at 1500 to get her pump.  Lurline Hare [facility] 05/03/2020, 8:41 AM

## 2020-07-04 ENCOUNTER — Ambulatory Visit: Payer: BC Managed Care – PPO | Admitting: Cardiovascular Disease

## 2022-06-13 IMAGING — CT CT ANGIO CHEST
2 of 8 series · 19 of 36 positions shown · IV contrast (Omnipaque)
Comparison: Chest x-ray 03/18/2020

CLINICAL DATA: Increased shortness of breath 30 weeks pregnant

EXAM:
CT ANGIOGRAPHY CHEST WITH CONTRAST
TECHNIQUE: Multidetector CT imaging of the chest was performed using the
standard protocol during bolus administration of intravenous
contrast. Multiplanar CT image reconstructions and MIPs were
obtained to evaluate the vascular anatomy.
CONTRAST:  67mL OMNIPAQUE IOHEXOL 350 MG/ML SOLN

[Series 6: pe thins · axial · 0.60mm/px · z∈[-157,+51]mm · 18 of 234 slices shown]
[im 13/234  lung]
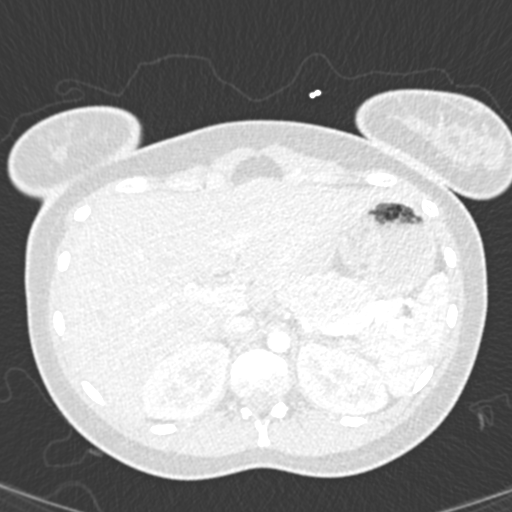
[im 25/234  mediastinal]
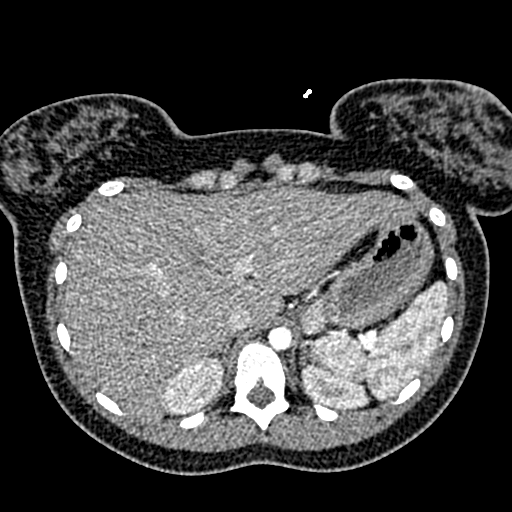
[im 37/234  lung]
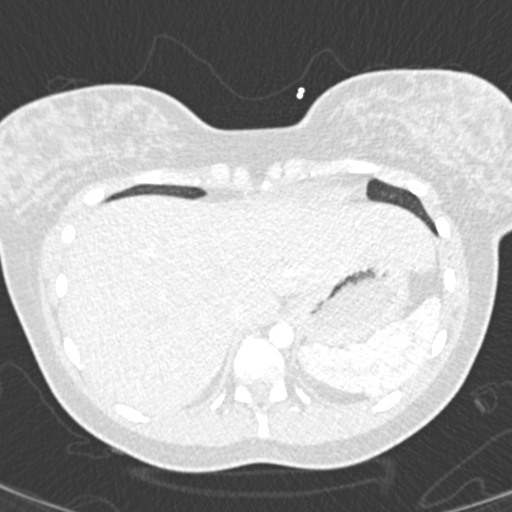
[im 50/234  mediastinal]
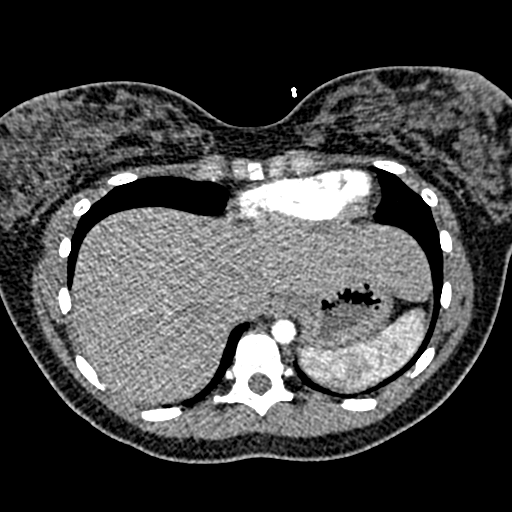
[im 62/234  lung]
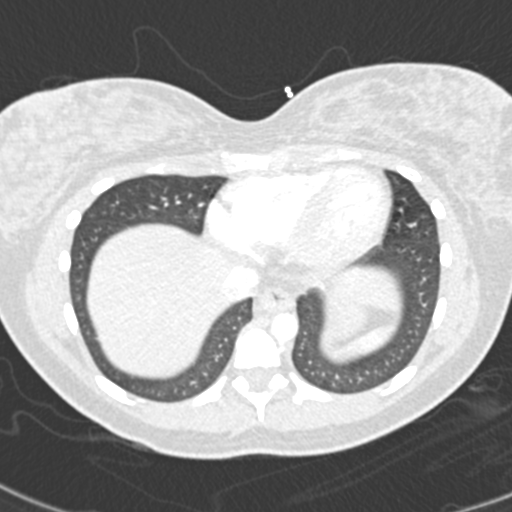
[im 74/234  mediastinal]
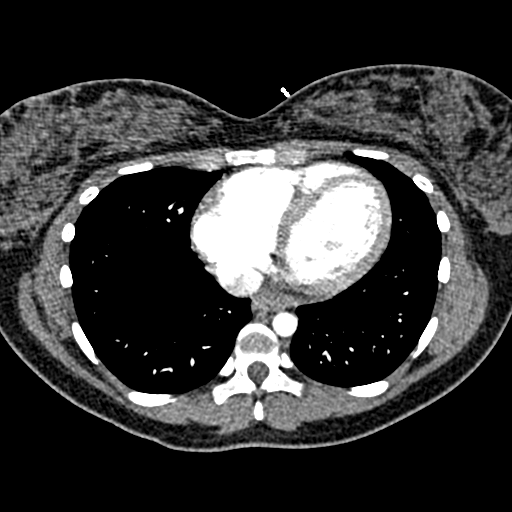
[im 86/234  lung]
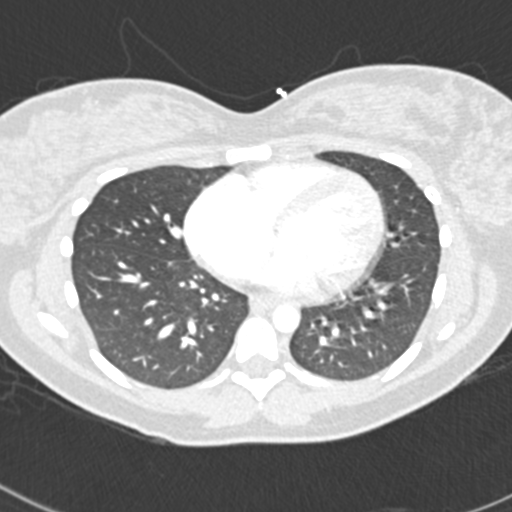
[im 99/234  mediastinal]
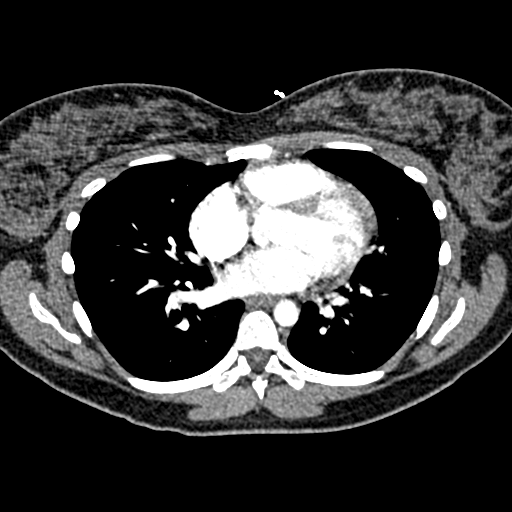
[im 111/234  lung]
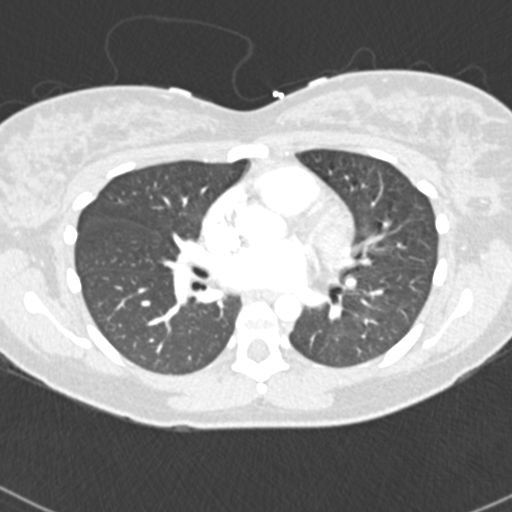
[im 123/234  mediastinal]
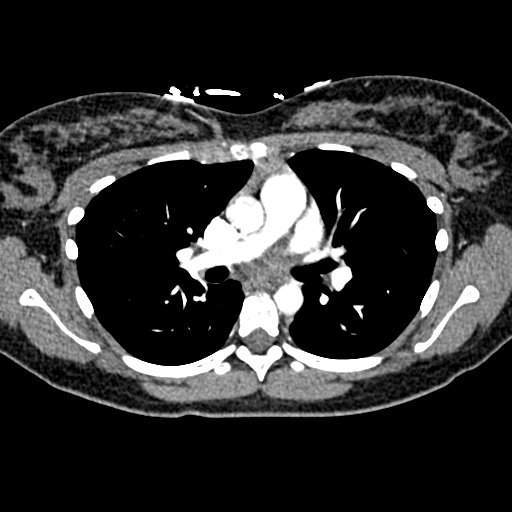
[im 135/234  lung]
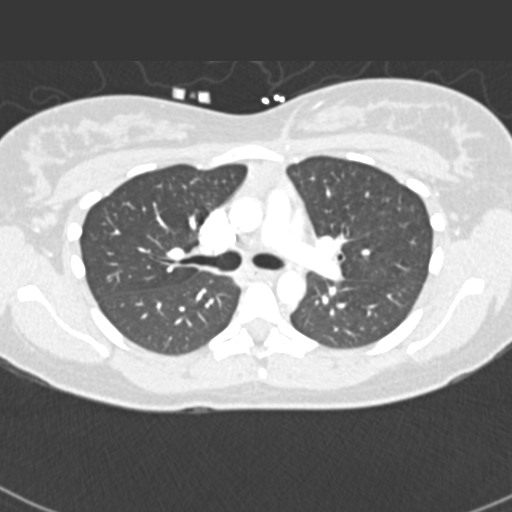
[im 148/234  mediastinal]
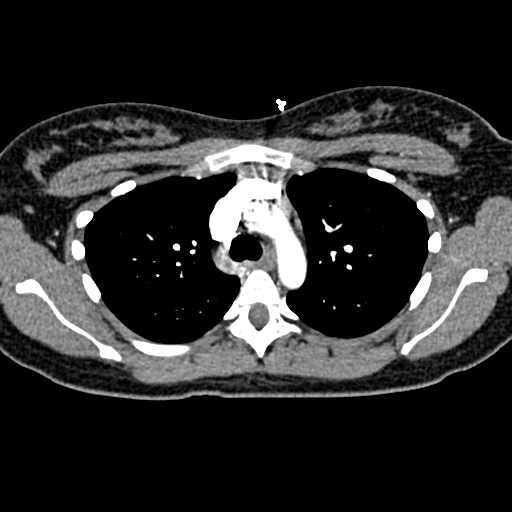
[im 160/234  lung]
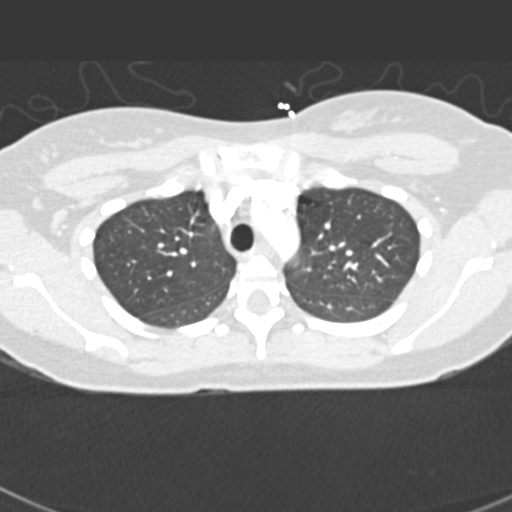
[im 172/234  mediastinal]
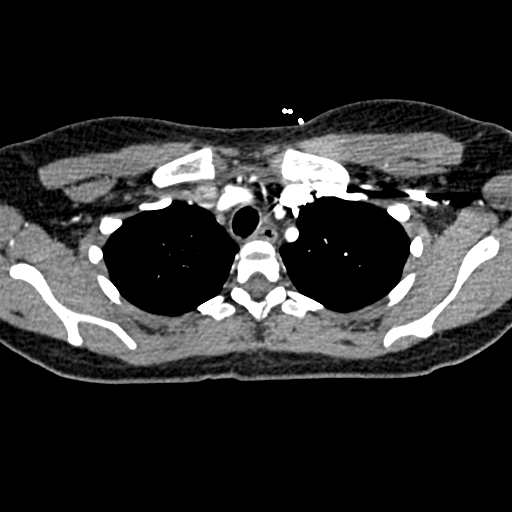
[im 184/234  lung]
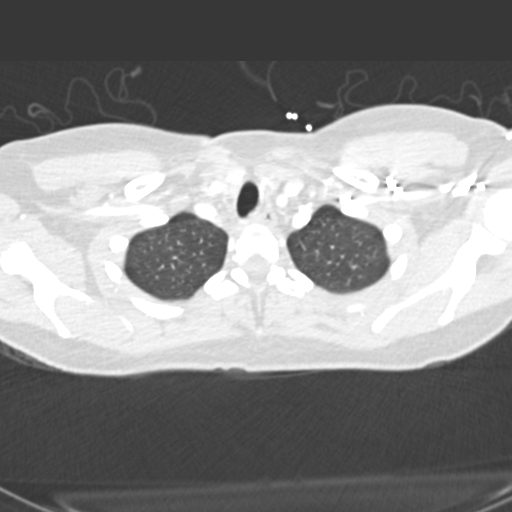
[im 197/234  mediastinal]
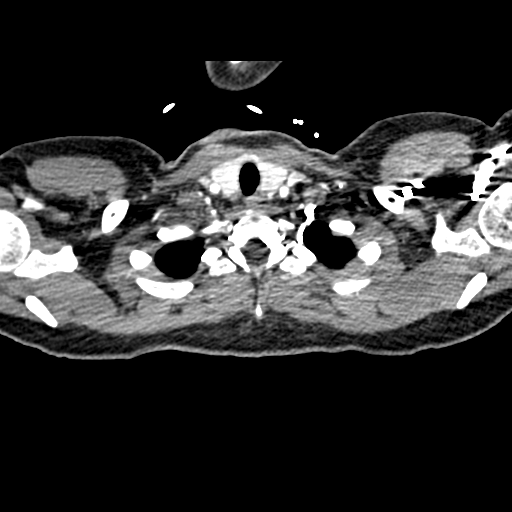
[im 209/234  lung]
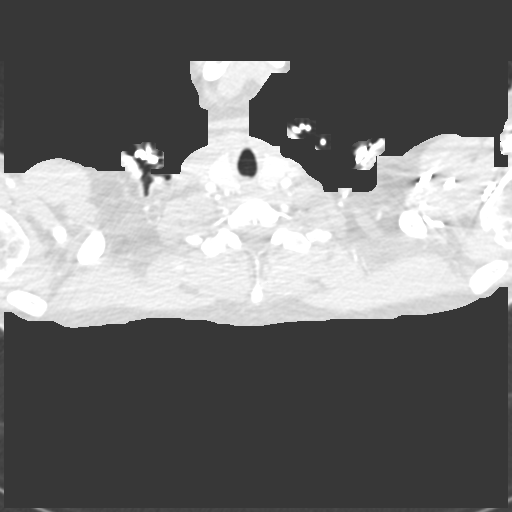
[im 221/234  mediastinal]
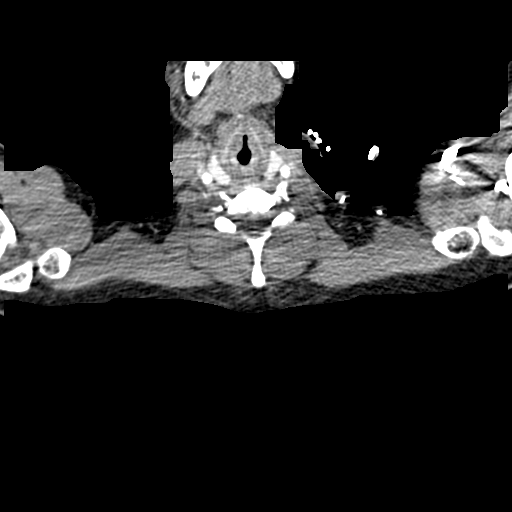

[Series 7: pe coronal mpr · coronal · 0.48mm/px · 1 of 96 slices shown]
[im 48/96  mediastinal]
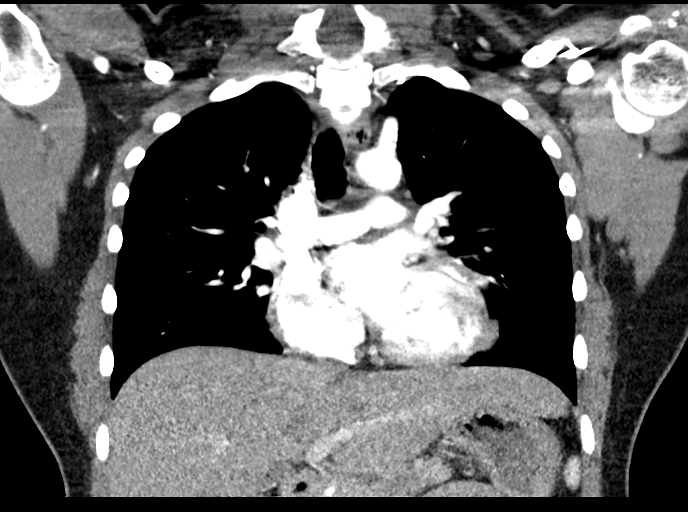

[19 of 36 positions shown; findings below may reference images not displayed]

FINDINGS: Cardiovascular: Satisfactory opacification of the pulmonary arteries
to the segmental level. No evidence of pulmonary embolism. Normal
heart size. No pericardial effusion. Nonaneurysmal aorta. No
dissection is seen.

Mediastinum/Nodes: No enlarged mediastinal, hilar, or axillary lymph
nodes. Thyroid gland, trachea, and esophagus demonstrate no
significant findings.

Lungs/Pleura: Lungs are clear. No pleural effusion or pneumothorax.

Upper Abdomen: No acute abnormality.

Musculoskeletal: No chest wall abnormality. No acute or significant
osseous findings.

Review of the MIP images confirms the above findings.
IMPRESSION: Negative. No CT evidence for acute pulmonary embolus or aortic
dissection. Clear lung fields.

## 2022-07-18 IMAGING — US US MFM OB FOLLOW-UP
1 series · 14 of 28 positions shown · non-contrast
Comparison: none

[Series 1: us mfm ob follow-up · 38 acquisitions, 14 frames shown]
[im 2/38]
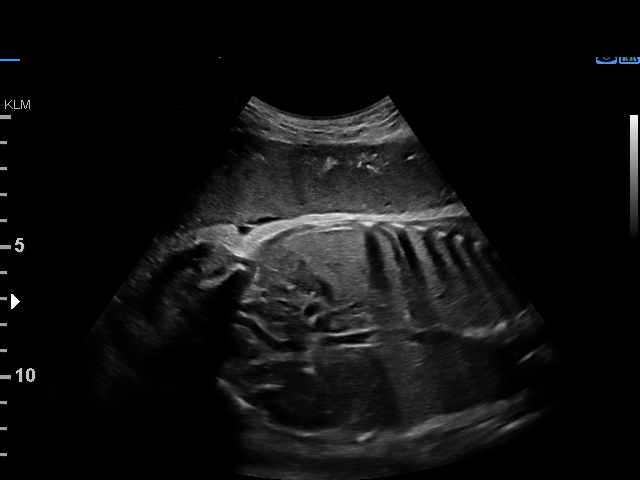
[im 5/38]
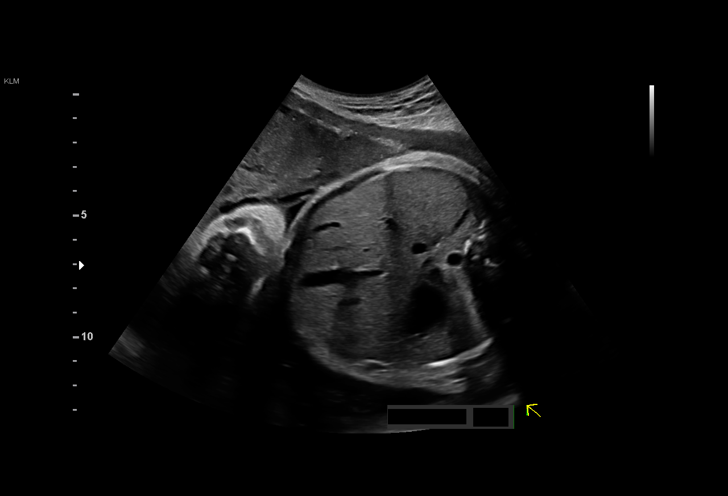
[im 7/38]
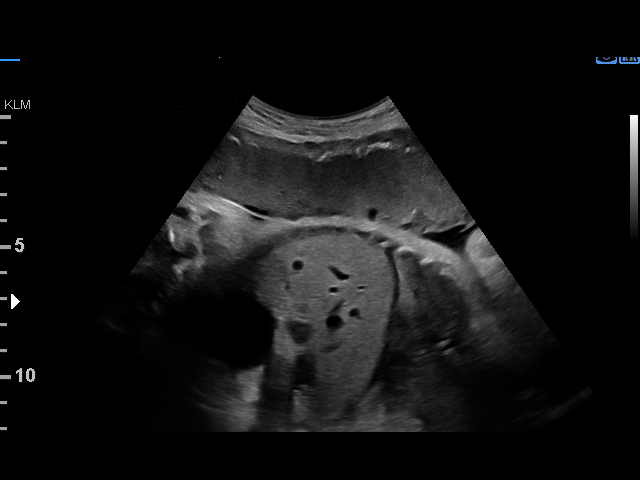
[im 10/38]
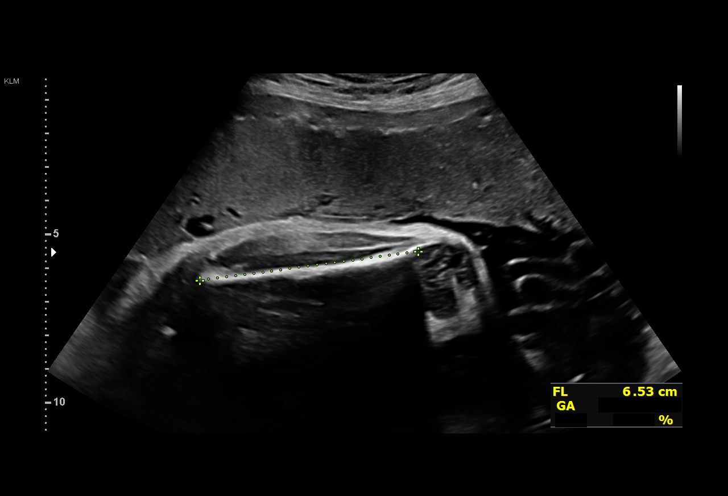
[im 13/38]
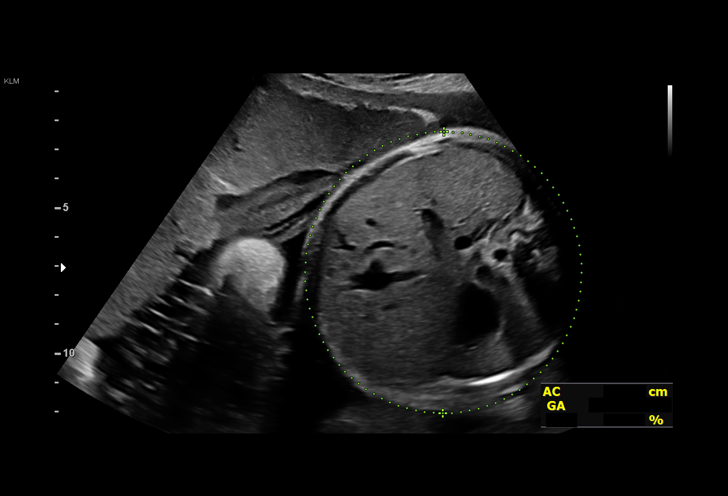
[im 16/38]
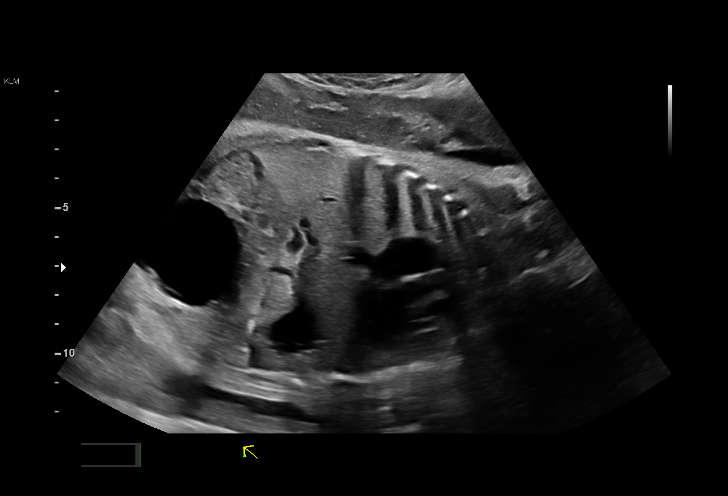
[im 18/38]
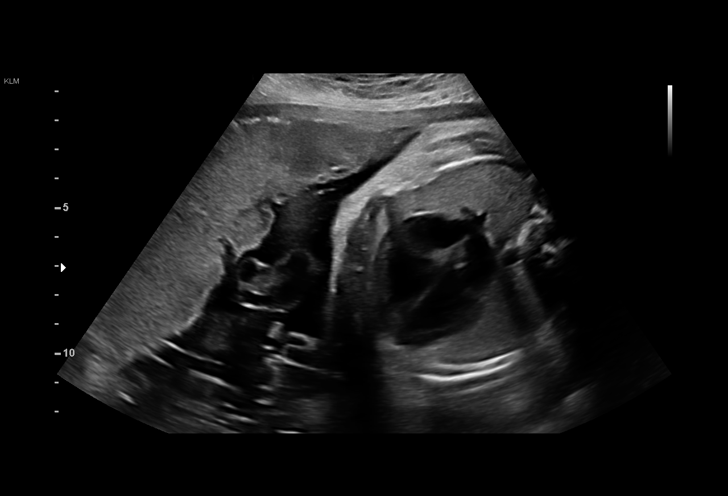
[im 21/38]
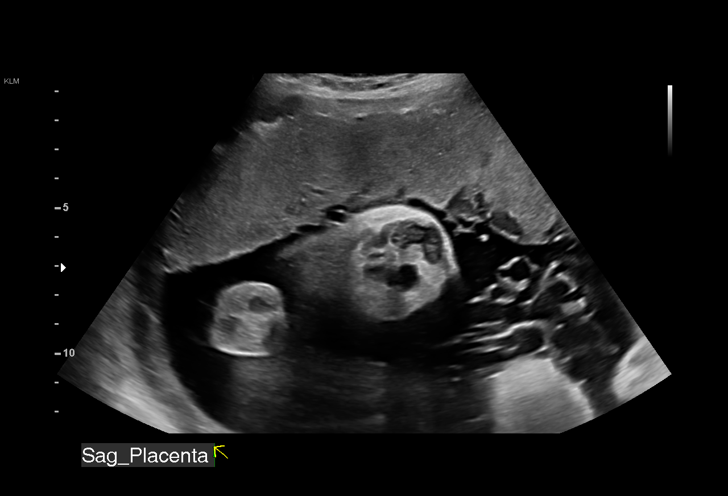
[im 24/38]
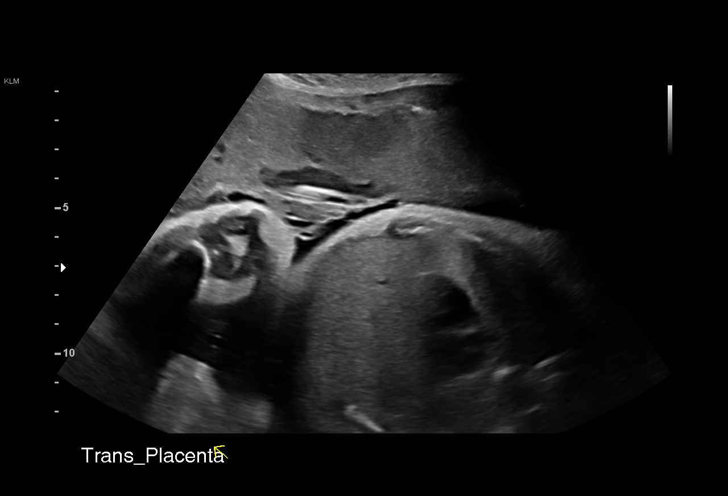
[im 27/38]
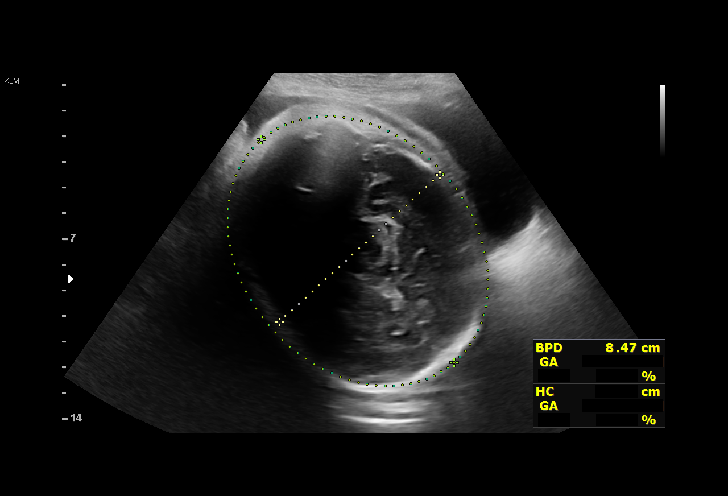
[im 29/38]
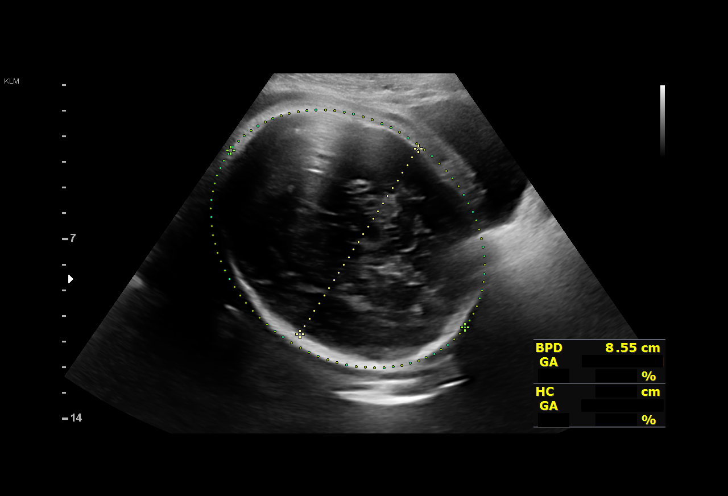
[im 32/38]
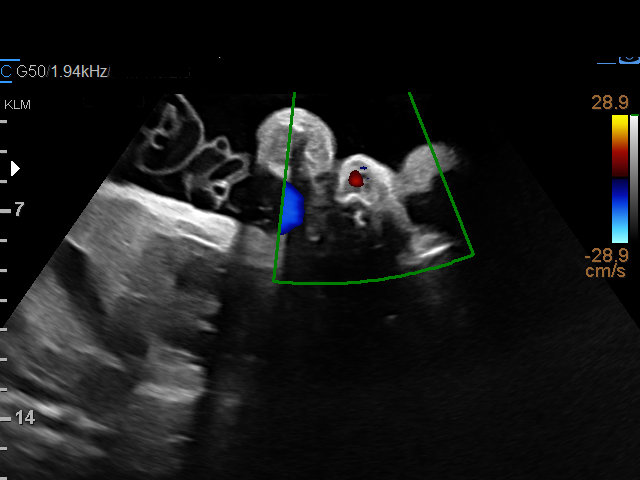
[im 35/38]
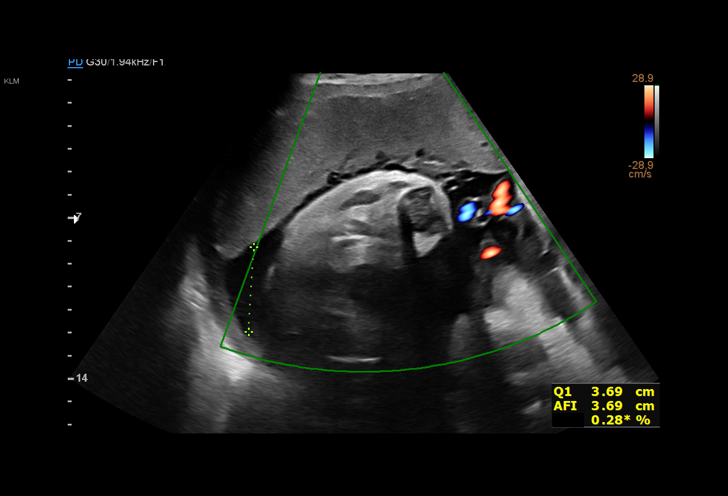
[im 38/38]
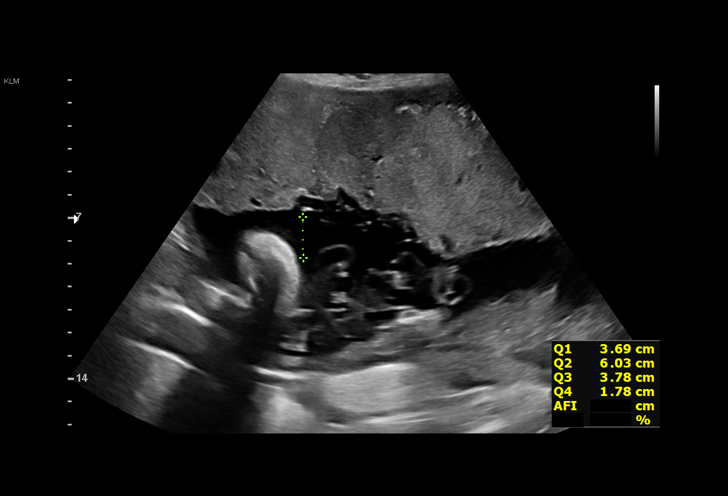

[14 of 28 positions shown; findings below may reference images not displayed]

Care

Indications

 Non-reactive NST, FHR decelerations
 35 weeks gestation of pregnancy
 Preterm labor
 Abnormal ultrasound finding on antenatal
 screening of mother (hypoplastic NB)
Fetal Evaluation

 Num Of Fetuses:         1
 Fetal Heart Rate(bpm):  145
 Cardiac Activity:       Observed
 Presentation:           Cephalic
 Placenta:               Anterior
 P. Cord Insertion:      Previously Visualized

 Amniotic Fluid
 AFI FV:      Within normal limits

 AFI Sum(cm)     %Tile       Largest Pocket(cm)
 15.3            56          6

 RUQ(cm)       RLQ(cm)       LUQ(cm)        LLQ(cm)
 3.7           1.8           6
Biophysical Evaluation

 Amniotic F.V:   Within normal limits       F. Tone:        Observed
 F. Movement:    Observed                   Score:          [DATE]
 F. Breathing:   Observed
Biometry

 BPD:      85.3  mm     G. Age:  34w 3d         28  %    CI:        69.69   %    70 - 86
                                                         FL/HC:      19.9   %    20.1 -
 HC:      326.1  mm     G. Age:  37w 0d         59  %    HC/AC:      1.06        0.93 -
 AC:      306.6  mm     G. Age:  34w 4d         38  %    FL/BPD:     76.2   %    71 - 87
 FL:         65  mm     G. Age:  33w 4d          8  %    FL/AC:      21.2   %    20 - 24

 Est. FW:    3413  gm      5 lb 6 oz     27  %
OB History

 Blood Type:   O+
 Gravidity:    1         Term:   0        Prem:   0        SAB:   0
 TOP:          0       Ectopic:  0        Living: 0
Gestational Age

 LMP:           35w 2d        Date:  08/25/19                 EDD:   05/31/20
 Clinical EDD:  35w 1d                                        EDD:   06/01/20
 U/S Today:     34w 6d                                        EDD:   06/03/20
 Best:          35w 2d     Det. By:  LMP  (08/25/19)          EDD:   05/31/20
Anatomy

 Cranium:               Appears normal         LVOT:                   Appears normal
 Cavum:                 Previously seen        Aortic Arch:            Previously seen
 Ventricles:            Appears normal         Ductal Arch:            Previously seen
 Choroid Plexus:        Previously seen        Diaphragm:              Appears normal
 Cerebellum:            Previously seen        Stomach:                Appears normal, left
                                                                       sided
 Posterior Fossa:       Previously seen        Abdomen:                Appears normal
 Nuchal Fold:           Previously seen        Abdominal Wall:         Previously seen
 Face:                  Hypoplastic nasal      Cord Vessels:           Previously seen
                        bone seen prev
 Lips:                  Appears normal         Kidneys:                Appear normal
 Palate:                Not well visualized    Bladder:                Appears normal
 Thoracic:              Appears normal         Spine:                  Previously seen
 Heart:                 Previously seen        Upper Extremities:      Previously seen
 RVOT:                  Appears normal         Lower Extremities:      Previously seen

 Other:  Male gender previously seen. Nasal bone visualized. Open hands
         and heels and 5th digit previously visualized.
Cervix Uterus Adnexa

 Cervix
 Not visualized (advanced GA >21wks)
Comments

 This patient has been hospitalized due to preterm labor.  Her
 cervix is 3 to 4 cm dilated.
 The fetal growth and amniotic fluid level appears appropriate
 for her gestational age.
 A biophysical profile performed today was [DATE].  The
 patient also had a reactive nonstress test following today's
 ultrasound.
